# Patient Record
Sex: Female | Born: 1955 | Hispanic: No | Marital: Married | Smoking: Never smoker
Health system: Southern US, Community
[De-identification: ages and names within clinical notes are randomized; demographics above are authoritative.]

## PROBLEM LIST (undated history)

## (undated) DIAGNOSIS — E785 Hyperlipidemia, unspecified: Secondary | ICD-10-CM

## (undated) DIAGNOSIS — T7840XA Allergy, unspecified, initial encounter: Secondary | ICD-10-CM

## (undated) DIAGNOSIS — I82409 Acute embolism and thrombosis of unspecified deep veins of unspecified lower extremity: Secondary | ICD-10-CM

## (undated) DIAGNOSIS — IMO0002 Reserved for concepts with insufficient information to code with codable children: Secondary | ICD-10-CM

## (undated) DIAGNOSIS — Z86711 Personal history of pulmonary embolism: Secondary | ICD-10-CM

## (undated) HISTORY — PX: PROLAPSED UTERINE FIBROID LIGATION: SHX5400

## (undated) HISTORY — DX: Reserved for concepts with insufficient information to code with codable children: IMO0002

## (undated) HISTORY — DX: Hyperlipidemia, unspecified: E78.5

## (undated) HISTORY — PX: BREAST CYST EXCISION: SHX579

## (undated) HISTORY — PX: ABDOMINAL HYSTERECTOMY: SHX81

## (undated) HISTORY — DX: Allergy, unspecified, initial encounter: T78.40XA

---

## 1995-01-11 DIAGNOSIS — Z86711 Personal history of pulmonary embolism: Secondary | ICD-10-CM

## 1995-01-11 DIAGNOSIS — I82409 Acute embolism and thrombosis of unspecified deep veins of unspecified lower extremity: Secondary | ICD-10-CM

## 1995-01-11 HISTORY — DX: Acute embolism and thrombosis of unspecified deep veins of unspecified lower extremity: I82.409

## 1995-01-11 HISTORY — DX: Personal history of pulmonary embolism: Z86.711

## 2006-06-22 ENCOUNTER — Ambulatory Visit: Payer: Self-pay | Admitting: Family Medicine

## 2007-07-05 ENCOUNTER — Ambulatory Visit: Payer: Self-pay

## 2007-11-11 ENCOUNTER — Ambulatory Visit: Payer: Self-pay | Admitting: Internal Medicine

## 2007-11-21 ENCOUNTER — Ambulatory Visit: Payer: Self-pay | Admitting: Unknown Physician Specialty

## 2007-11-29 ENCOUNTER — Ambulatory Visit: Payer: Self-pay | Admitting: Unknown Physician Specialty

## 2007-12-11 ENCOUNTER — Ambulatory Visit: Payer: Self-pay | Admitting: Internal Medicine

## 2007-12-14 ENCOUNTER — Ambulatory Visit: Payer: Self-pay | Admitting: Internal Medicine

## 2007-12-19 ENCOUNTER — Emergency Department: Payer: Self-pay | Admitting: Unknown Physician Specialty

## 2008-01-11 ENCOUNTER — Ambulatory Visit: Payer: Self-pay | Admitting: Internal Medicine

## 2008-03-26 ENCOUNTER — Ambulatory Visit: Payer: Self-pay | Admitting: Internal Medicine

## 2008-04-10 ENCOUNTER — Ambulatory Visit: Payer: Self-pay | Admitting: Internal Medicine

## 2009-06-07 ENCOUNTER — Ambulatory Visit: Payer: Self-pay | Admitting: Internal Medicine

## 2009-06-25 ENCOUNTER — Ambulatory Visit: Payer: Self-pay | Admitting: Family Medicine

## 2010-07-29 ENCOUNTER — Ambulatory Visit: Payer: Self-pay | Admitting: Family Medicine

## 2011-08-03 ENCOUNTER — Ambulatory Visit: Payer: Self-pay | Admitting: Family Medicine

## 2013-11-27 ENCOUNTER — Ambulatory Visit: Payer: Self-pay | Admitting: Family Medicine

## 2015-11-19 ENCOUNTER — Other Ambulatory Visit: Payer: Self-pay | Admitting: Family Medicine

## 2015-11-19 DIAGNOSIS — Z1231 Encounter for screening mammogram for malignant neoplasm of breast: Secondary | ICD-10-CM

## 2015-11-23 ENCOUNTER — Ambulatory Visit
Admission: RE | Admit: 2015-11-23 | Discharge: 2015-11-23 | Disposition: A | Discharge status: Home / Self Care | Payer: BC Managed Care – PPO | Source: Ambulatory Visit | Attending: Family Medicine | Admitting: Family Medicine

## 2015-11-23 ENCOUNTER — Encounter (INDEPENDENT_AMBULATORY_CARE_PROVIDER_SITE_OTHER): Payer: Self-pay

## 2015-11-23 DIAGNOSIS — Z1231 Encounter for screening mammogram for malignant neoplasm of breast: Secondary | ICD-10-CM | POA: Insufficient documentation

## 2016-05-01 ENCOUNTER — Ambulatory Visit
Admission: EM | Admit: 2016-05-01 | Discharge: 2016-05-01 | Disposition: A | Discharge status: Home / Self Care | Payer: BC Managed Care – PPO | Attending: Family Medicine | Admitting: Family Medicine

## 2016-05-01 ENCOUNTER — Encounter: Payer: Self-pay | Admitting: Emergency Medicine

## 2016-05-01 DIAGNOSIS — L237 Allergic contact dermatitis due to plants, except food: Secondary | ICD-10-CM

## 2016-05-01 DIAGNOSIS — R21 Rash and other nonspecific skin eruption: Secondary | ICD-10-CM

## 2016-05-01 MED ORDER — PREDNISONE 20 MG PO TABS: ORAL_TABLET | ORAL | 0 refills | Status: DC

## 2016-05-01 NOTE — ED Triage Notes (Signed)
Patient c/o itchy rash on her arms, thighs, and abdomen that started on Friday.

## 2016-05-01 NOTE — ED Provider Notes (Signed)
[facility]    CSN: [id number] Arrival date & time: 05/01/16  1349     History   Chief Complaint Chief Complaint  Patient presents with  . Rash    HPI Elizabeth Frye is a 61 y.o. female.   61 yo female with a c/o itchy, blistery rash to legs, forearms and abdomen after exposure to poison oak/ivy last week. Has been putting calamine lotion and taking benadryl but not improved.   The history is provided by the patient.    History reviewed. No pertinent past medical history.  There are no active problems to display for this patient.   Past Surgical History:  Procedure Laterality Date  . ABDOMINAL HYSTERECTOMY    . BREAST CYST EXCISION Right    neg    OB History    No data available       Home Medications    Prior to Admission medications   Medication Sig Start Date End Date Taking? Authorizing Provider  simvastatin (ZOCOR) 40 MG tablet Take 40 mg by mouth daily.   Yes Historical Provider, MD  predniSONE (DELTASONE) 20 MG tablet 3 tabs po qd for 2 days, then 2 tabs po qd for 3 days, then 1 tab po qd for 4 days, then half a tab po qd for 3 days 05/01/16   Norval Gable, MD    Family History Family History  Problem Relation Age of Onset  . Breast cancer Neg Hx     Social History Social History  Substance Use Topics  . Smoking status: Never Smoker  . Smokeless tobacco: Never Used  . Alcohol use Yes     Allergies   Sulfa antibiotics   Review of Systems Review of Systems   Physical Exam Triage Vital Signs ED Triage Vitals  Enc Vitals Group     BP 05/01/16 1452 (!) 143/69     Pulse Rate 05/01/16 1452 94     Resp 05/01/16 1452 16     Temp 05/01/16 1452 98.4 F (36.9 C)     Temp Source 05/01/16 1452 Oral     SpO2 05/01/16 1452 99 %     Weight 05/01/16 1450 160 lb (72.6 kg)     Height 05/01/16 1450 5\' 1"  (1.549 m)     Head Circumference --      Peak Flow --      Pain Score 05/01/16 1450 2     Pain Loc --      Pain Edu? --        Excl. in [place]? --    No data found.   Updated Vital Signs BP (!) 143/69 (BP Location: Left Arm)   Pulse 94   Temp 98.4 F (36.9 C) (Oral)   Resp 16   Ht 5\' 1"  (1.549 m)   Wt 160 lb (72.6 kg)   SpO2 99%   BMI 30.23 kg/m   Visual Acuity Right Eye Distance:   Left Eye Distance:   Bilateral Distance:    Right Eye Near:   Left Eye Near:    Bilateral Near:     Physical Exam  Constitutional: She appears well-developed and well-nourished. No distress.  Skin: Rash noted. Rash is vesicular. She is not diaphoretic. There is erythema.     Nursing note and vitals reviewed.    UC Treatments / Results  Labs (all labs ordered are listed, but only abnormal results are displayed) Labs Reviewed - No data to display  EKG  EKG Interpretation None  Radiology No results found.  Procedures Procedures (including critical care time)  Medications Ordered in UC Medications - No data to display   Initial Impression / Assessment and Plan / UC Course  I have reviewed the triage vital signs and the nursing notes.  Pertinent labs & imaging results that were available during my care of the patient were reviewed by me and considered in my medical decision making (see chart for details).       Final Clinical Impressions(s) / UC Diagnoses   Final diagnoses:  Contact dermatitis due to poison oak    New Prescriptions Discharge Medication List as of 05/01/2016  2:59 PM    START taking these medications   Details  predniSONE (DELTASONE) 20 MG tablet 3 tabs po qd for 2 days, then 2 tabs po qd for 3 days, then 1 tab po qd for 4 days, then half a tab po qd for 3 days, Normal       1. diagnosis reviewed with patient 2. rx as per orders above; reviewed possible side effects, interactions, risks and benefits  3. Recommend supportive treatment with otc antihistamines prn 4. Follow-up prn if symptoms worsen or don't improve   Norval Gable, MD 05/01/16 1505

## 2016-10-31 LAB — HIV ANTIBODY (ROUTINE TESTING W REFLEX): HIV 1&2 Ab, 4th Generation: NEGATIVE

## 2017-11-08 LAB — FECAL OCCULT BLOOD, GUAIAC: Fecal Occult Blood: POSITIVE

## 2017-12-22 ENCOUNTER — Other Ambulatory Visit: Payer: Self-pay

## 2017-12-22 ENCOUNTER — Other Ambulatory Visit: Payer: Self-pay | Admitting: Family Medicine

## 2017-12-22 DIAGNOSIS — Z1231 Encounter for screening mammogram for malignant neoplasm of breast: Secondary | ICD-10-CM

## 2017-12-22 DIAGNOSIS — Z1211 Encounter for screening for malignant neoplasm of colon: Secondary | ICD-10-CM

## 2017-12-22 MED ORDER — NA SULFATE-K SULFATE-MG SULF 17.5-3.13-1.6 GM/177ML PO SOLN: 1.0000 | ORAL | 0 refills | Status: DC

## 2018-01-04 NOTE — Discharge Instructions (Signed)
General Anesthesia, Adult, Care After  This sheet gives you information about how to care for yourself after your procedure. Your health care provider may also give you more specific instructions. If you have problems or questions, contact your health care provider.  What can I expect after the procedure?  After the procedure, the following side effects are common:  Pain or discomfort at the IV site.  Nausea.  Vomiting.  Sore throat.  Trouble concentrating.  Feeling cold or chills.  Weak or tired.  Sleepiness and fatigue.  Soreness and body aches. These side effects can affect parts of the body that were not involved in surgery.  Follow these instructions at home:    For at least 24 hours after the procedure:  Have a responsible adult stay with you. It is important to have someone help care for you until you are awake and alert.  Rest as needed.  Do not:  Participate in activities in which you could fall or become injured.  Drive.  Use heavy machinery.  Drink alcohol.  Take sleeping pills or medicines that cause drowsiness.  Make important decisions or sign legal documents.  Take care of children on your own.  Eating and drinking  Follow any instructions from your health care provider about eating or drinking restrictions.  When you feel hungry, start by eating small amounts of foods that are soft and easy to digest (bland), such as toast. Gradually return to your regular diet.  Drink enough fluid to keep your urine pale yellow.  If you vomit, rehydrate by drinking water, juice, or clear broth.  General instructions  If you have sleep apnea, surgery and certain medicines can increase your risk for breathing problems. Follow instructions from your health care provider about wearing your sleep device:  Anytime you are sleeping, including during daytime naps.  While taking prescription pain medicines, sleeping medicines, or medicines that make you drowsy.  Return to your normal activities as told by your health care  provider. Ask your health care provider what activities are safe for you.  Take over-the-counter and prescription medicines only as told by your health care provider.  If you smoke, do not smoke without supervision.  Keep all follow-up visits as told by your health care provider. This is important.  Contact a health care provider if:  You have nausea or vomiting that does not get better with medicine.  You cannot eat or drink without vomiting.  You have pain that does not get better with medicine.  You are unable to pass urine.  You develop a skin rash.  You have a fever.  You have redness around your IV site that gets worse.  Get help right away if:  You have difficulty breathing.  You have chest pain.  You have blood in your urine or stool, or you vomit blood.  Summary  After the procedure, it is common to have a sore throat or nausea. It is also common to feel tired.  Have a responsible adult stay with you for the first 24 hours after general anesthesia. It is important to have someone help care for you until you are awake and alert.  When you feel hungry, start by eating small amounts of foods that are soft and easy to digest (bland), such as toast. Gradually return to your regular diet.  Drink enough fluid to keep your urine pale yellow.  Return to your normal activities as told by your health care provider. Ask your health care   provider what activities are safe for you.  This information is not intended to replace advice given to you by your health care provider. Make sure you discuss any questions you have with your health care provider.  Document Released: 04/04/2000 Document Revised: 08/12/2016 Document Reviewed: 08/12/2016  Elsevier Interactive Patient Education  2019 [employer]vier Inc[employer]

## 2018-01-05 ENCOUNTER — Ambulatory Visit: Payer: BC Managed Care – PPO | Admitting: Anesthesiology

## 2018-01-05 ENCOUNTER — Ambulatory Visit
Admission: RE | Admit: 2018-01-05 | Discharge: 2018-01-05 | Disposition: A | Discharge status: Home / Self Care | Payer: BC Managed Care – PPO | Source: Ambulatory Visit | Attending: Gastroenterology | Admitting: Gastroenterology

## 2018-01-05 ENCOUNTER — Encounter
Admission: RE | Disposition: A | Discharge status: Home / Self Care | Payer: Self-pay | Source: Ambulatory Visit | Attending: Gastroenterology

## 2018-01-05 DIAGNOSIS — Z86711 Personal history of pulmonary embolism: Secondary | ICD-10-CM | POA: Diagnosis not present

## 2018-01-05 DIAGNOSIS — D120 Benign neoplasm of cecum: Secondary | ICD-10-CM

## 2018-01-05 DIAGNOSIS — Z1211 Encounter for screening for malignant neoplasm of colon: Secondary | ICD-10-CM | POA: Insufficient documentation

## 2018-01-05 DIAGNOSIS — Z86718 Personal history of other venous thrombosis and embolism: Secondary | ICD-10-CM | POA: Insufficient documentation

## 2018-01-05 DIAGNOSIS — K5730 Diverticulosis of large intestine without perforation or abscess without bleeding: Secondary | ICD-10-CM | POA: Insufficient documentation

## 2018-01-05 DIAGNOSIS — K635 Polyp of colon: Secondary | ICD-10-CM | POA: Diagnosis not present

## 2018-01-05 DIAGNOSIS — K641 Second degree hemorrhoids: Secondary | ICD-10-CM | POA: Insufficient documentation

## 2018-01-05 DIAGNOSIS — D123 Benign neoplasm of transverse colon: Secondary | ICD-10-CM

## 2018-01-05 DIAGNOSIS — Z79899 Other long term (current) drug therapy: Secondary | ICD-10-CM | POA: Diagnosis not present

## 2018-01-05 DIAGNOSIS — Z860100 Personal history of colon polyps, unspecified: Secondary | ICD-10-CM

## 2018-01-05 HISTORY — DX: Personal history of pulmonary embolism: Z86.711

## 2018-01-05 HISTORY — PX: POLYPECTOMY: SHX149

## 2018-01-05 HISTORY — PX: COLONOSCOPY WITH PROPOFOL: SHX5780

## 2018-01-05 HISTORY — DX: Acute embolism and thrombosis of unspecified deep veins of unspecified lower extremity: I82.409

## 2018-01-05 SURGERY — COLONOSCOPY WITH PROPOFOL
Anesthesia: General

## 2018-01-05 MED ORDER — SODIUM CHLORIDE 0.9 % IV SOLN: INTRAVENOUS | Status: DC

## 2018-01-05 MED ORDER — LIDOCAINE HCL (CARDIAC) PF 100 MG/5ML IV SOSY
PREFILLED_SYRINGE | INTRAVENOUS | Status: DC | PRN
  Administered 2018-01-05: 30 mg via INTRAVENOUS

## 2018-01-05 MED ORDER — LACTATED RINGERS IV SOLN
INTRAVENOUS | Status: DC
  Administered 2018-01-05: 08:00:00 via INTRAVENOUS

## 2018-01-05 MED ORDER — ACETAMINOPHEN 160 MG/5ML PO SOLN: 325.0000 mg | Freq: Once | ORAL | Status: DC

## 2018-01-05 MED ORDER — PROPOFOL 10 MG/ML IV BOLUS
INTRAVENOUS | Status: DC | PRN
  Administered 2018-01-05: 30 mg via INTRAVENOUS
  Administered 2018-01-05: 20 mg via INTRAVENOUS
  Administered 2018-01-05 (×8): 50 mg via INTRAVENOUS

## 2018-01-05 MED ORDER — ACETAMINOPHEN 325 MG PO TABS: 325.0000 mg | ORAL_TABLET | Freq: Once | ORAL | Status: DC

## 2018-01-05 SURGICAL SUPPLY — 21 items
CANISTER SUCT 1200ML W/VALVE (MISCELLANEOUS) ×3 IMPLANT
CLIP HMST 235XBRD CATH ROT (MISCELLANEOUS) IMPLANT
CLIP RESOLUTION 360 11X235 (MISCELLANEOUS)
ELECT REM PT RETURN 9FT ADLT (ELECTROSURGICAL)
ELECTRODE REM PT RTRN 9FT ADLT (ELECTROSURGICAL) IMPLANT
FCP ESCP3.2XJMB 240X2.8X (MISCELLANEOUS)
FORCEPS BIOP RAD 4 LRG CAP 4 (CUTTING FORCEPS) IMPLANT
FORCEPS BIOP RJ4 240 W/NDL (MISCELLANEOUS)
FORCEPS ESCP3.2XJMB 240X2.8X (MISCELLANEOUS) IMPLANT
GOWN CVR UNV OPN BCK APRN NK (MISCELLANEOUS) ×4 IMPLANT
GOWN ISOL THUMB LOOP REG UNIV (MISCELLANEOUS) ×2
INJECTOR VARIJECT VIN23 (MISCELLANEOUS) ×2 IMPLANT
KIT ENDO PROCEDURE OLY (KITS) ×3 IMPLANT
PROBE APC STR FIRE (PROBE) IMPLANT
SNARE SHORT THROW 13M SML OVAL (MISCELLANEOUS) ×3 IMPLANT
SNARE SHORT THROW 30M LRG OVAL (MISCELLANEOUS) IMPLANT
SNARE SNG USE RND 15MM (INSTRUMENTS) IMPLANT
SNARE SPIRAL (MISCELLANEOUS) ×3 IMPLANT
TRAP ETRAP POLY (MISCELLANEOUS) ×3 IMPLANT
VARIJECT INJECTOR VIN23 (MISCELLANEOUS) ×3
WATER STERILE IRR 250ML POUR (IV SOLUTION) ×3 IMPLANT

## 2018-01-05 NOTE — Anesthesia Postprocedure Evaluation (Signed)
Anesthesia Post Note  Patient: Elizabeth Frye  Procedure(s) Performed: COLONOSCOPY WITH PROPOFOL (N/A ) POLYPECTOMY INTESTINAL  Patient location during evaluation: PACU Anesthesia Type: General Level of consciousness: awake and alert and oriented Pain management: satisfactory to patient Vital Signs Assessment: post-procedure vital signs reviewed and stable Respiratory status: spontaneous breathing, nonlabored ventilation and respiratory function stable Cardiovascular status: blood pressure returned to baseline and stable Postop Assessment: Adequate PO intake and No signs of nausea or vomiting Anesthetic complications: no    Raliegh Ip

## 2018-01-05 NOTE — H&P (Signed)
Lucilla Lame, MD [address] [place], [place] [address] Phone: [phone] Fax : [phone]  Primary Care Physician:  Petra Kuba, MD Primary Gastroenterologist:  Dr. Allen Norris  Pre-Procedure History & Physical: HPI:  Elizabeth Frye is a 62 y.o. female is here for a screening colonoscopy.   Past Medical History:  Diagnosis Date  . DVT, lower extremity ([place]) 1997   after injury to leg  . History of pulmonary embolus ([place]) 1997    Past Surgical History:  Procedure Laterality Date  . ABDOMINAL HYSTERECTOMY    . BREAST CYST EXCISION Right    neg    Prior to Admission medications   Medication Sig Start Date End Date Taking? Authorizing Provider  Multiple Vitamins-Minerals (WOMENS MULTIVITAMIN PO) Take by mouth.   Yes [provider]  Na Sulfate-K Sulfate-Mg Sulf (SUPREP BOWEL PREP KIT) 17.5-3.13-1.6 GM/177ML SOLN Take 1 kit by mouth as directed. 12/22/17  Yes Lucilla Lame, MD  Potassium 75 MG TABS Take by mouth.   Yes [provider]  simvastatin (ZOCOR) 40 MG tablet Take 40 mg by mouth daily.   Yes [provider]  predniSONE (DELTASONE) 20 MG tablet 3 tabs po qd for 2 days, then 2 tabs po qd for 3 days, then 1 tab po qd for 4 days, then half a tab po qd for 3 days Patient not taking: Reported on 01/01/2018 05/01/16   Norval Gable, MD    Allergies as of 12/22/2017 - Review Complete 05/01/2016  Allergen Reaction Noted  . Sulfa antibiotics Rash 05/01/2016    Family History  Problem Relation Age of Onset  . Breast cancer Neg Hx     Social History   Socioeconomic History  . Marital status: Married    Spouse name: Not on file  . Number of children: Not on file  . Years of education: Not on file  . Highest education level: Not on file  Occupational History  . Not on file  Social Needs  . Financial resource strain: Not on file  . Food insecurity:    Worry: Not on file    Inability: Not on file  . Transportation  needs:    Medical: Not on file    Non-medical: Not on file  Tobacco Use  . Smoking status: Never Smoker  . Smokeless tobacco: Never Used  Substance and Sexual Activity  . Alcohol use: Yes    Alcohol/week: 3.0 - 4.0 standard drinks    Types: 3 - 4 Glasses of wine per week  . Drug use: Not on file  . Sexual activity: Not on file  Lifestyle  . Physical activity:    Days per week: Not on file    Minutes per session: Not on file  . Stress: Not on file  Relationships  . Social connections:    Talks on phone: Not on file    Gets together: Not on file    Attends religious service: Not on file    Active member of club or organization: Not on file    Attends meetings of clubs or organizations: Not on file    Relationship status: Not on file  . Intimate partner violence:    Fear of current or ex partner: Not on file    Emotionally abused: Not on file    Physically abused: Not on file    Forced sexual activity: Not on file  Other Topics Concern  . Not on file  Social History Narrative  .  Not on file    Review of Systems: See HPI, otherwise negative ROS  Physical Exam: BP (!) 150/76   Pulse 95   Temp 98.1 F (36.7 C) (Temporal)   Ht 5' 1"  (1.549 m)   Wt 78.5 kg   SpO2 100%   BMI 32.69 kg/m  General:   Alert,  pleasant and cooperative in NAD Head:  Normocephalic and atraumatic. Neck:  Supple; no masses or thyromegaly. Lungs:  Clear throughout to auscultation.    Heart:  Regular rate and rhythm. Abdomen:  Soft, nontender and nondistended. Normal bowel sounds, without guarding, and without rebound.   Neurologic:  Alert and  oriented x4;  grossly normal neurologically.  Impression/Plan: Shivani Barrantes is now here to undergo a screening colonoscopy.  Risks, benefits, and alternatives regarding colonoscopy have been reviewed with the patient.  Questions have been answered.  All parties agreeable.

## 2018-01-05 NOTE — Transfer of Care (Signed)
Immediate Anesthesia Transfer of Care Note  Patient: Elizabeth Frye  Procedure(s) Performed: COLONOSCOPY WITH PROPOFOL (N/A ) POLYPECTOMY INTESTINAL  Patient Location: PACU  Anesthesia Type: General  Level of Consciousness: awake, alert  and patient cooperative  Airway and Oxygen Therapy: Patient Spontanous Breathing and Patient connected to supplemental oxygen  Post-op Assessment: Post-op Vital signs reviewed, Patient's Cardiovascular Status Stable, Respiratory Function Stable, Patent Airway and No signs of Nausea or vomiting  Post-op Vital Signs: Reviewed and stable  Complications: No apparent anesthesia complications

## 2018-01-05 NOTE — Op Note (Signed)
[facility]astroenterology Patient Name: Elizabeth Frye Procedure Date: 01/05/2018 8:46 AM MRN: [id number] Account #: [id number] Date of Birth: Dec 17, 1955 Admit Type: Outpatient Age: 62 Room: [facility] OR ROOM 01 Gender: Female Note Status: Finalized Procedure:            Colonoscopy Indications:          Screening for colorectal malignant neoplasm Providers:            Lucilla Lame MD, MD Referring MD:         Sena Hitch, MD (Referring MD) Medicines:            Propofol per Anesthesia Complications:        No immediate complications. Procedure:            Pre-Anesthesia Assessment:                       - Prior to the procedure, a History and Physical was                        performed, and patient medications and allergies were                        reviewed. The patient's tolerance of previous                        anesthesia was also reviewed. The risks and benefits of                        the procedure and the sedation options and risks were                        discussed with the patient. All questions were                        answered, and informed consent was obtained. Prior                        Anticoagulants: The patient has taken no previous                        anticoagulant or antiplatelet agents. ASA Grade                        Assessment: II - A patient with mild systemic disease.                        After reviewing the risks and benefits, the patient was                        deemed in satisfactory condition to undergo the                        procedure.                       After obtaining informed consent, the colonoscope was                        passed under direct vision. Throughout the procedure,  the patient's blood pressure, pulse, and oxygen                        saturations were monitored continuously. The                        Colonoscope was introduced through the anus and           advanced to the the terminal ileum. The colonoscopy was                        performed without difficulty. The patient tolerated the                        procedure well. The quality of the bowel preparation                        was excellent. Findings:      The perianal and digital rectal examinations were normal.      A 9 mm polyp was found in the ileocecal valve. The polyp was sessile.       Area was successfully injected with saline with indigo carmine for a       lift polypectomy. The polyp was removed with a hot snare. Resection and       retrieval were complete.      A 5 mm polyp was found in the hepatic flexure. The polyp was sessile.       The polyp was removed with a cold snare. Resection and retrieval were       complete.      Multiple small-mouthed diverticula were found in the sigmoid colon.      Non-bleeding internal hemorrhoids were found during retroflexion. The       hemorrhoids were Grade II (internal hemorrhoids that prolapse but reduce       spontaneously). Impression:           - One 9 mm polyp at the ileocecal valve, removed with a                        hot snare. Resected and retrieved. Injected.                       - One 5 mm polyp at the hepatic flexure, removed with a                        cold snare. Resected and retrieved.                       - Diverticulosis in the sigmoid colon.                       - Non-bleeding internal hemorrhoids. Recommendation:       - Discharge patient to home.                       - Resume previous diet.                       - Continue present medications.                       - Await pathology results.                       -  Repeat colonoscopy in 1 years if polyp adenoma and 10                        years if hyperplastic Procedure Code(s):    --- Professional ---                       [phone], Colonoscopy, flexible; with removal of tumor(s),                        polyp(s), or other lesion(s) by snare  technique                       45381, Colonoscopy, flexible; with directed submucosal                        injection(s), any substance Diagnosis Code(s):    --- Professional ---                       Z12.11, Encounter for screening for malignant neoplasm                        of colon                       D12.0, Benign neoplasm of cecum                       D12.3, Benign neoplasm of transverse colon (hepatic                        flexure or splenic flexure) CPT copyright 2018 American Medical Association. All rights reserved. The codes documented in this report are preliminary and upon coder review may  be revised to meet current compliance requirements. Lucilla Lame MD, MD 01/05/2018 9:25:17 AM This report has been signed electronically. Number of Addenda: 0 Note Initiated On: 01/05/2018 8:46 AM Scope Withdrawal Time: 0 hours 22 minutes 5 seconds  Total Procedure Duration: 0 hours 24 minutes 27 seconds       [facility]

## 2018-01-05 NOTE — Anesthesia Preprocedure Evaluation (Signed)
Anesthesia Evaluation  Patient identified by MRN, date of birth, ID band Patient awake    Reviewed: Allergy & Precautions, H&P , NPO status , Patient's Chart, lab work & pertinent test results  Airway Mallampati: II  TM Distance: >3 FB Neck ROM: full    Dental no notable dental hx.    Pulmonary    Pulmonary exam normal breath sounds clear to auscultation       Cardiovascular Normal cardiovascular exam Rhythm:regular Rate:Normal     Neuro/Psych    GI/Hepatic   Endo/Other    Renal/GU      Musculoskeletal   Abdominal   Peds  Hematology   Anesthesia Other Findings   Reproductive/Obstetrics                             Anesthesia Physical Anesthesia Plan  ASA: II  Anesthesia Plan: General   Post-op Pain Management:    Induction: Intravenous  PONV Risk Score and Plan: 3 and Propofol infusion and Treatment may vary due to age or medical condition  Airway Management Planned: Natural Airway  Additional Equipment:   Intra-op Plan:   Post-operative Plan:   Informed Consent: I have reviewed the patients History and Physical, chart, labs and discussed the procedure including the risks, benefits and alternatives for the proposed anesthesia with the patient or authorized representative who has indicated his/her understanding and acceptance.     Plan Discussed with: CRNA  Anesthesia Plan Comments:         Anesthesia Quick Evaluation

## 2018-01-08 ENCOUNTER — Encounter: Payer: Self-pay | Admitting: Gastroenterology

## 2018-01-09 ENCOUNTER — Ambulatory Visit
Admission: RE | Admit: 2018-01-09 | Discharge: 2018-01-09 | Disposition: A | Discharge status: Home / Self Care | Payer: BC Managed Care – PPO | Source: Ambulatory Visit | Attending: Family Medicine | Admitting: Family Medicine

## 2018-01-09 ENCOUNTER — Encounter: Payer: Self-pay | Admitting: Gastroenterology

## 2018-01-09 DIAGNOSIS — Z1231 Encounter for screening mammogram for malignant neoplasm of breast: Secondary | ICD-10-CM | POA: Diagnosis present

## 2018-12-14 LAB — HM PAP SMEAR: HM Pap smear: NEGATIVE

## 2018-12-14 LAB — LIPID PANEL: Cholesterol: 214 — AB (ref 0–200)

## 2018-12-14 LAB — HEMOGLOBIN A1C: Hemoglobin A1C: 5.6

## 2018-12-15 LAB — LIPID PANEL
Cholesterol: 214 — AB (ref 0–200)
HDL: 53 (ref 35–70)
LDL Cholesterol: 113
Triglycerides: 277 — AB (ref 40–160)

## 2018-12-15 LAB — TSH: TSH: 1.4 (ref 0.41–5.90)

## 2018-12-15 LAB — HEMOGLOBIN A1C: Hemoglobin A1C: 5.6

## 2018-12-26 ENCOUNTER — Other Ambulatory Visit: Payer: Self-pay | Admitting: Internal Medicine

## 2018-12-26 ENCOUNTER — Encounter: Payer: Self-pay | Admitting: Internal Medicine

## 2018-12-26 ENCOUNTER — Ambulatory Visit: Payer: BC Managed Care – PPO | Admitting: Internal Medicine

## 2018-12-26 ENCOUNTER — Other Ambulatory Visit: Payer: Self-pay

## 2018-12-26 VITALS — BP 132/90 | HR 100 | Ht 61.0 in | Wt 176.0 lb

## 2018-12-26 DIAGNOSIS — Z6833 Body mass index (BMI) 33.0-33.9, adult: Secondary | ICD-10-CM | POA: Diagnosis not present

## 2018-12-26 DIAGNOSIS — E785 Hyperlipidemia, unspecified: Secondary | ICD-10-CM | POA: Insufficient documentation

## 2018-12-26 NOTE — Progress Notes (Signed)
Date:  12/26/2018   Name:  Elizabeth Frye   DOB:  August 07, 1955   MRN:  [id number]   Chief Complaint: [place] patient today.  She recently had CPX with her other provider in November.  She has had mammogram and recently colonoscopy.  She is currently doing well with no complaints. Hyperlipidemia This is a chronic problem. The problem is controlled. Pertinent negatives include no chest pain or shortness of breath. Current antihyperlipidemic treatment includes statins. The current treatment provides significant improvement of lipids.   Lab Results  Component Value Date   CHOL 214 (A) 12/14/2018   Lab Results  Component Value Date   HGBA1C 5.6 12/14/2018     Review of Systems  Constitutional: Negative for chills, fatigue and unexpected weight change.  Respiratory: Negative for chest tightness and shortness of breath.   Cardiovascular: Negative for chest pain and leg swelling.  Neurological: Negative for dizziness, light-headedness and headaches.    Patient Active Problem List   Diagnosis Date Noted  . Colon cancer screening   . Benign neoplasm of cecum   . Benign neoplasm of transverse colon     Allergies  Allergen Reactions  . Sulfa Antibiotics Rash    Past Surgical History:  Procedure Laterality Date  . ABDOMINAL HYSTERECTOMY     still have cervix and one ovary  . BREAST CYST EXCISION Right    neg  . COLONOSCOPY WITH PROPOFOL N/A 01/05/2018   Procedure: COLONOSCOPY WITH PROPOFOL;  Surgeon: Lucilla Lame, MD;  Location: [facility];  Service: Endoscopy;  Laterality: N/A;  . POLYPECTOMY  01/05/2018   Procedure: POLYPECTOMY INTESTINAL;  Surgeon: Lucilla Lame, MD;  Location: [facility];  Service: Endoscopy;;  . PROLAPSED UTERINE FIBROID LIGATION      Social History   Tobacco Use  . Smoking status: Never Smoker  . Smokeless tobacco: Never Used  Substance Use Topics  . Alcohol use: Yes    Alcohol/week: 3.0 standard drinks    Types:  3 Glasses of wine per week  . Drug use: Never     Medication list has been reviewed and updated.  Current Meds  Medication Sig  . Multiple Vitamins-Minerals (WOMENS MULTIVITAMIN PO) Take by mouth.  . Potassium 75 MG TABS Take by mouth.  . simvastatin (ZOCOR) 40 MG tablet Take 40 mg by mouth daily.    PHQ 2/9 Scores 12/26/2018  PHQ - 2 Score 0    BP Readings from Last 3 Encounters:  12/26/18 132/90  01/05/18 113/84  05/01/16 (!) 143/69    Physical Exam Vitals and nursing note reviewed.  Constitutional:      General: She is not in acute distress.    Appearance: Normal appearance. She is well-developed.  HENT:     Head: Normocephalic and atraumatic.  Neck:     Vascular: No carotid bruit.  Cardiovascular:     Rate and Rhythm: Normal rate and regular rhythm.     Pulses: Normal pulses.     Heart sounds: No murmur.  Pulmonary:     Effort: Pulmonary effort is normal. No respiratory distress.     Breath sounds: No wheezing or rhonchi.  Musculoskeletal:     Right lower leg: No edema.     Left lower leg: No edema.  Lymphadenopathy:     Cervical: No cervical adenopathy.  Skin:    General: Skin is warm and dry.     Capillary Refill: Capillary refill takes less than 2 seconds.  Findings: No rash.  Neurological:     General: No focal deficit present.     Mental Status: She is alert and oriented to person, place, and time.     Gait: Gait normal.  Psychiatric:        Behavior: Behavior normal.        Thought Content: Thought content normal.     Wt Readings from Last 3 Encounters:  12/26/18 176 lb (79.8 kg)  01/05/18 173 lb (78.5 kg)  05/01/16 160 lb (72.6 kg)    BP 132/90   Pulse 100   Ht 5\' 1"  (1.549 m)   Wt 176 lb (79.8 kg)   SpO2 95%   BMI 33.25 kg/m   Assessment and Plan: 1. Hyperlipidemia, mild Controlled on statin therapy - recent labs will be requested Continue same dose and call for refill when needed  2. BMI 33.0-33.9,adult Continue to work on  healthy diet and regular exercise Recent A1C was normal.  Records for immunizations and screening labs will be requested.  Partially dictated using Editor, commissioning. Any errors are unintentional.  Halina Maidens, MD [facility]  12/26/2018

## 2019-01-24 LAB — HM MAMMOGRAPHY: HM Mammogram: NORMAL (ref 0–4)

## 2019-04-04 ENCOUNTER — Ambulatory Visit: Payer: BC Managed Care – PPO | Attending: Internal Medicine

## 2019-04-04 DIAGNOSIS — Z23 Encounter for immunization: Secondary | ICD-10-CM

## 2019-04-04 NOTE — Progress Notes (Signed)
   Covid-19 Vaccination Clinic  Name:  Elizabeth Frye    MRN: [id number] DOB: [birth year]  04/04/2019  Elizabeth Frye was observed post Covid-19 immunization for 15 minutes without incident. She was provided with Vaccine Information Sheet and instruction to access the V-Safe system.   Elizabeth Frye was instructed to call 911 with any severe reactions post vaccine: Marland Kitchen Difficulty breathing  . Swelling of face and throat  . A fast heartbeat  . A bad rash all over body  . Dizziness and weakness   Immunizations Administered    Name Date Dose VIS Date Route   Pfizer COVID-19 Vaccine 04/04/2019 11:16 AM 0.3 mL 12/21/2018 Intramuscular   Manufacturer: [place]   Lot: [id number]   [place]: [id number]

## 2019-04-29 ENCOUNTER — Ambulatory Visit: Payer: BC Managed Care – PPO | Attending: Internal Medicine

## 2019-04-29 DIAGNOSIS — Z23 Encounter for immunization: Secondary | ICD-10-CM

## 2019-04-29 NOTE — Progress Notes (Signed)
   Covid-19 Vaccination Clinic  Name:  Elizabeth Frye    MRN: [id number] DOB: 04-05-1955  04/29/2019  [name] was observed post Covid-19 immunization for 15 minutes without incident. She was provided with Vaccine Information Sheet and instruction to access the V-Safe system.   [name] was instructed to call 911 with any severe reactions post vaccine: Marland Kitchen Difficulty breathing  . Swelling of face and throat  . A fast heartbeat  . A bad rash all over body  . Dizziness and weakness   Immunizations Administered    Name Date Dose VIS Date Route   Pfizer COVID-19 Vaccine 04/29/2019 11:27 AM 0.3 mL 03/06/2018 Intramuscular   Manufacturer: [place]   Lot: [id number]   [place]: [id number]

## 2019-12-04 ENCOUNTER — Encounter: Payer: Self-pay | Admitting: Internal Medicine

## 2019-12-04 ENCOUNTER — Other Ambulatory Visit: Payer: Self-pay

## 2019-12-04 ENCOUNTER — Ambulatory Visit (INDEPENDENT_AMBULATORY_CARE_PROVIDER_SITE_OTHER): Payer: BC Managed Care – PPO | Admitting: Internal Medicine

## 2019-12-04 VITALS — BP 124/82 | HR 86 | Temp 98.1°F | Ht 61.0 in | Wt 180.0 lb

## 2019-12-04 DIAGNOSIS — E785 Hyperlipidemia, unspecified: Secondary | ICD-10-CM

## 2019-12-04 DIAGNOSIS — Z1159 Encounter for screening for other viral diseases: Secondary | ICD-10-CM | POA: Diagnosis not present

## 2019-12-04 DIAGNOSIS — Z0000 Encounter for general adult medical examination without abnormal findings: Secondary | ICD-10-CM | POA: Diagnosis not present

## 2019-12-04 DIAGNOSIS — Z1231 Encounter for screening mammogram for malignant neoplasm of breast: Secondary | ICD-10-CM

## 2019-12-04 MED ORDER — SIMVASTATIN 40 MG PO TABS: 40.0000 mg | ORAL_TABLET | Freq: Every day | ORAL | 3 refills | Status: DC

## 2019-12-04 NOTE — Patient Instructions (Signed)
Consider Shingrix vaccine for shingles.  Can schedule appt with the Nurse for the first injection if desired.

## 2019-12-04 NOTE — Progress Notes (Signed)
Date:  12/04/2019   Name:  Elizabeth Frye   DOB:  05/30/1955   MRN:  [id number]   Chief Complaint: Annual Exam (breast exam no pap)  Elizabeth Frye is a 64 y.o. female who presents today for her Complete Annual Exam. She feels well. She reports exercising walking up and down the stairs and yard work. She reports she is sleeping well. Breast complaints none.  Mammogram: 01/2017 [facility] DEXA: none Pap smear: 12/2018 neg with HPV Colonoscopy: 12/2017 repeat 2024  Immunization History  Administered Date(s) Administered  . Influenza Inj Mdck Quad Pf 11/02/2018  . Influenza-Unspecified 11/01/2019  . PFIZER SARS-COV-2 Vaccination 04/04/2019, 04/29/2019, 11/01/2019  . Tdap 11/18/2015  . Zoster 11/20/2014  Shingrix discussed.  Hyperlipidemia This is a chronic problem. The problem is controlled. Pertinent negatives include no chest pain or shortness of breath. Current antihyperlipidemic treatment includes statins. The current treatment provides significant improvement of lipids.    No results found for: CREATININE, BUN, NA, K, CL, CO2 Lab Results  Component Value Date   CHOL 214 (A) 12/15/2018   HDL 53 12/15/2018   LDLCALC 113 12/15/2018   TRIG 277 (A) 12/15/2018   Lab Results  Component Value Date   TSH 1.40 12/15/2018   Lab Results  Component Value Date   HGBA1C 5.6 12/15/2018   No results found for: WBC, HGB, HCT, MCV, PLT No results found for: ALT, AST, GGT, ALKPHOS, BILITOT   Review of Systems  Constitutional: Negative for chills, fatigue and fever.  HENT: Negative for congestion, hearing loss, tinnitus, trouble swallowing and voice change.   Eyes: Negative for visual disturbance.  Respiratory: Negative for cough, chest tightness, shortness of breath and wheezing.   Cardiovascular: Negative for chest pain, palpitations and leg swelling.  Gastrointestinal: Negative for abdominal pain, constipation, diarrhea and vomiting.  Endocrine: Negative for polydipsia and  polyuria.  Genitourinary: Negative for dysuria, frequency, genital sores, vaginal bleeding and vaginal discharge.  Musculoskeletal: Positive for arthralgias (pain on left foot). Negative for gait problem and joint swelling.  Skin: Negative for color change and rash.  Neurological: Negative for dizziness, tremors, light-headedness and headaches.  Hematological: Negative for adenopathy. Does not bruise/bleed easily.  Psychiatric/Behavioral: Negative for dysphoric mood and sleep disturbance. The patient is not nervous/anxious.     Patient Active Problem List   Diagnosis Date Noted  . Hyperlipidemia, mild 12/26/2018  . BMI 33.0-33.9,adult 12/26/2018  . Benign neoplasm of cecum   . Benign neoplasm of transverse colon     Allergies  Allergen Reactions  . Sulfa Antibiotics Rash    Past Surgical History:  Procedure Laterality Date  . ABDOMINAL HYSTERECTOMY     still have cervix and one ovary  . BREAST CYST EXCISION Right    neg  . COLONOSCOPY WITH PROPOFOL N/A 01/05/2018   Procedure: COLONOSCOPY WITH PROPOFOL;  Surgeon: Lucilla Lame, MD;  Location: [place];  Service: Endoscopy;  Laterality: N/A;  . POLYPECTOMY  01/05/2018   Procedure: POLYPECTOMY INTESTINAL;  Surgeon: Lucilla Lame, MD;  Location: [place];  Service: Endoscopy;;  . PROLAPSED UTERINE FIBROID LIGATION      Social History   Tobacco Use  . Smoking status: Never Smoker  . Smokeless tobacco: Never Used  Vaping Use  . Vaping Use: Never used  Substance Use Topics  . Alcohol use: Yes    Alcohol/week: 3.0 standard drinks    Types: 3 Glasses of wine per week  . Drug use: Never  Medication list has been reviewed and updated.  Current Meds  Medication Sig  . Multiple Vitamins-Minerals (WOMENS MULTIVITAMIN PO) Take by mouth.  . Potassium 75 MG TABS Take by mouth.  . simvastatin (ZOCOR) 40 MG tablet Take 40 mg by mouth daily.    PHQ 2/9 Scores 12/04/2019 12/26/2018  PHQ - 2 Score 0 0    PHQ- 9 Score 0 -    GAD 7 : Generalized Anxiety Score 12/04/2019  Nervous, Anxious, on Edge 0  Control/stop worrying 0  Worry too much - different things 0  Trouble relaxing 0  Restless 0  Easily annoyed or irritable 0  Afraid - awful might happen 0  Total GAD 7 Score 0    BP Readings from Last 3 Encounters:  12/04/19 140/74  12/26/18 132/90  01/05/18 113/84    Physical Exam Vitals and nursing note reviewed.  Constitutional:      General: She is not in acute distress.    Appearance: She is well-developed.  HENT:     Head: Normocephalic and atraumatic.     Right Ear: Tympanic membrane and ear canal normal.     Left Ear: Tympanic membrane and ear canal normal.     Nose:     Right Sinus: No maxillary sinus tenderness.     Left Sinus: No maxillary sinus tenderness.  Eyes:     General: No scleral icterus.       Right eye: No discharge.        Left eye: No discharge.     Conjunctiva/sclera: Conjunctivae normal.  Neck:     Thyroid: No thyromegaly.     Vascular: No carotid bruit.  Cardiovascular:     Rate and Rhythm: Normal rate and regular rhythm.     Pulses: Normal pulses.     Heart sounds: Normal heart sounds.  Pulmonary:     Effort: Pulmonary effort is normal. No respiratory distress.     Breath sounds: No wheezing.  Chest:     Breasts:        Right: No mass, nipple discharge, skin change or tenderness.        Left: No mass, nipple discharge, skin change or tenderness.  Abdominal:     General: Bowel sounds are normal.     Palpations: Abdomen is soft.     Tenderness: There is no abdominal tenderness.  Musculoskeletal:     Cervical back: Normal range of motion. No erythema.     Right lower leg: No edema.     Left lower leg: No edema.  Lymphadenopathy:     Cervical: No cervical adenopathy.  Skin:    General: Skin is warm and dry.     Capillary Refill: Capillary refill takes less than 2 seconds.     Findings: No rash.  Neurological:     General: No focal  deficit present.     Mental Status: She is alert and oriented to person, place, and time.     Cranial Nerves: No cranial nerve deficit.     Sensory: No sensory deficit.     Deep Tendon Reflexes: Reflexes are normal and symmetric.  Psychiatric:        Attention and Perception: Attention normal.        Mood and Affect: Mood normal.     Wt Readings from Last 3 Encounters:  12/04/19 180 lb (81.6 kg)  12/26/18 176 lb (79.8 kg)  01/05/18 173 lb (78.5 kg)    BP 140/74   Pulse (!) 107  Temp 98.1 F (36.7 C) (Oral)   Ht 5\' 1"  (1.549 m)   Wt 180 lb (81.6 kg)   SpO2 95%   BMI 34.01 kg/m   Assessment and Plan: 1. Annual physical exam Exam is normal except for weight. Encourage regular exercise and appropriate dietary changes. - CBC with Differential/Platelet - TSH - Hemoglobin A1c  2. Encounter for screening mammogram for breast cancer To be scheduled at [place]; Future  3. Need for hepatitis C screening test - Hepatitis C antibody  4. Hyperlipidemia, mild On statin therapy without side effects or other concerns - Comprehensive metabolic panel - Lipid panel  Pt will consider Shingrix vaccine and call for nurse visit if desired. Partially dictated using Editor, commissioning. Any errors are unintentional.  Halina Maidens, MD [facility]  12/04/2019

## 2019-12-05 LAB — COMPREHENSIVE METABOLIC PANEL
ALT: 23 IU/L (ref 0–32)
AST: 22 IU/L (ref 0–40)
Albumin/Globulin Ratio: 1.8 (ref 1.2–2.2)
Albumin: 4.6 g/dL (ref 3.8–4.8)
Alkaline Phosphatase: 78 IU/L (ref 44–121)
BUN/Creatinine Ratio: 18 (ref 12–28)
BUN: 15 mg/dL (ref 8–27)
Bilirubin Total: 0.4 mg/dL (ref 0.0–1.2)
CO2: 19 mmol/L — ABNORMAL LOW (ref 20–29)
Calcium: 9.7 mg/dL (ref 8.7–10.3)
Chloride: 104 mmol/L (ref 96–106)
Creatinine, Ser: 0.84 mg/dL (ref 0.57–1.00)
GFR calc Af Amer: 86 mL/min/{1.73_m2} (ref >59)
GFR calc non Af Amer: 75 mL/min/{1.73_m2} (ref >59)
Globulin, Total: 2.6 g/dL (ref 1.5–4.5)
Glucose: 126 mg/dL — ABNORMAL HIGH (ref 65–99)
Potassium: 4.6 mmol/L (ref 3.5–5.2)
Sodium: 140 mmol/L (ref 134–144)
Total Protein: 7.2 g/dL (ref 6.0–8.5)

## 2019-12-05 LAB — CBC WITH DIFFERENTIAL/PLATELET
Basophils Absolute: 0.1 10*3/uL (ref 0.0–0.2)
Basos: 1 %
EOS (ABSOLUTE): 0.2 10*3/uL (ref 0.0–0.4)
Eos: 4 %
Hematocrit: 47.5 % — ABNORMAL HIGH (ref 34.0–46.6)
Hemoglobin: 16.1 g/dL — ABNORMAL HIGH (ref 11.1–15.9)
Immature Grans (Abs): 0 10*3/uL (ref 0.0–0.1)
Immature Granulocytes: 1 %
Lymphocytes Absolute: 2.1 10*3/uL (ref 0.7–3.1)
Lymphs: 34 %
MCH: 30.7 pg (ref 26.6–33.0)
MCHC: 33.9 g/dL (ref 31.5–35.7)
MCV: 91 fL (ref 79–97)
Monocytes Absolute: 0.6 10*3/uL (ref 0.1–0.9)
Monocytes: 9 %
Neutrophils Absolute: 3.3 10*3/uL (ref 1.4–7.0)
Neutrophils: 51 %
Platelets: 231 10*3/uL (ref 150–450)
RBC: 5.25 x10E6/uL (ref 3.77–5.28)
RDW: 12.5 % (ref 11.7–15.4)
WBC: 6.3 10*3/uL (ref 3.4–10.8)

## 2019-12-05 LAB — LIPID PANEL
Chol/HDL Ratio: 3.9 ratio (ref 0.0–4.4)
Cholesterol, Total: 215 mg/dL — ABNORMAL HIGH (ref 100–199)
HDL: 55 mg/dL (ref >39)
LDL Chol Calc (NIH): 114 mg/dL — ABNORMAL HIGH (ref 0–99)
Triglycerides: 266 mg/dL — ABNORMAL HIGH (ref 0–149)
VLDL Cholesterol Cal: 46 mg/dL — ABNORMAL HIGH (ref 5–40)

## 2019-12-05 LAB — HEPATITIS C ANTIBODY: Hep C Virus Ab: 0.2 s/co ratio (ref 0.0–0.9)

## 2019-12-05 LAB — TSH: TSH: 2.02 u[IU]/mL (ref 0.450–4.500)

## 2019-12-05 LAB — HEMOGLOBIN A1C
Est. average glucose Bld gHb Est-mCnc: 117 mg/dL
Hgb A1c MFr Bld: 5.7 % — ABNORMAL HIGH (ref 4.8–5.6)

## 2020-01-02 ENCOUNTER — Other Ambulatory Visit: Payer: Self-pay

## 2020-01-02 ENCOUNTER — Encounter: Payer: Self-pay | Admitting: Internal Medicine

## 2020-01-02 ENCOUNTER — Ambulatory Visit: Payer: BC Managed Care – PPO | Admitting: Internal Medicine

## 2020-01-02 VITALS — BP 128/92 | HR 96 | Temp 98.0°F | Ht 61.0 in | Wt 180.0 lb

## 2020-01-02 DIAGNOSIS — J019 Acute sinusitis, unspecified: Secondary | ICD-10-CM | POA: Diagnosis not present

## 2020-01-02 MED ORDER — AMOXICILLIN-POT CLAVULANATE 875-125 MG PO TABS: 1.0000 | ORAL_TABLET | Freq: Two times a day (BID) | ORAL | 0 refills | Status: AC

## 2020-01-02 NOTE — Progress Notes (Signed)
Date:  01/02/2020   Name:  Elizabeth Frye   DOB:  [birth year]   MRN:  [id number]   Chief Complaint: Cough (X1 week, Runny nose, headache, no fever, throat sore when drinking)  Sinusitis This is a new problem. The current episode started in the past 7 days. The problem is unchanged. There has been no fever. She is experiencing no pain. Associated symptoms include congestion, coughing, sinus pressure and a sore throat. Pertinent negatives include no chills, diaphoresis, headaches or shortness of breath.    Lab Results  Component Value Date   CREATININE 0.84 12/04/2019   BUN 15 12/04/2019   NA 140 12/04/2019   K 4.6 12/04/2019   CL 104 12/04/2019   CO2 19 (L) 12/04/2019   Lab Results  Component Value Date   CHOL 215 (H) 12/04/2019   HDL 55 12/04/2019   LDLCALC 114 (H) 12/04/2019   TRIG 266 (H) 12/04/2019   CHOLHDL 3.9 12/04/2019   Lab Results  Component Value Date   TSH 2.020 12/04/2019   Lab Results  Component Value Date   HGBA1C 5.7 (H) 12/04/2019   Lab Results  Component Value Date   WBC 6.3 12/04/2019   HGB 16.1 (H) 12/04/2019   HCT 47.5 (H) 12/04/2019   MCV 91 12/04/2019   PLT 231 12/04/2019   Lab Results  Component Value Date   ALT 23 12/04/2019   AST 22 12/04/2019   ALKPHOS 78 12/04/2019   BILITOT 0.4 12/04/2019     Review of Systems  Constitutional: Negative for chills, diaphoresis, fatigue and fever.  HENT: Positive for congestion, postnasal drip, sinus pressure and sore throat. Negative for trouble swallowing.   Respiratory: Positive for cough. Negative for chest tightness, shortness of breath and wheezing.   Cardiovascular: Negative for chest pain and palpitations.  Neurological: Negative for dizziness and headaches.    Patient Active Problem List   Diagnosis Date Noted  . Hyperlipidemia, mild 12/26/2018  . BMI 33.0-33.9,adult 12/26/2018  . Benign neoplasm of cecum   . Benign neoplasm of transverse colon     Allergies  Allergen  Reactions  . Sulfa Antibiotics Rash    Past Surgical History:  Procedure Laterality Date  . ABDOMINAL HYSTERECTOMY     still have cervix and one ovary  . BREAST CYST EXCISION Right    neg  . COLONOSCOPY WITH PROPOFOL N/A 01/05/2018   Procedure: COLONOSCOPY WITH PROPOFOL;  Surgeon: Lucilla Lame, MD;  Location: [place];  Service: Endoscopy;  Laterality: N/A;  . POLYPECTOMY  01/05/2018   Procedure: POLYPECTOMY INTESTINAL;  Surgeon: Lucilla Lame, MD;  Location: [place];  Service: Endoscopy;;  . PROLAPSED UTERINE FIBROID LIGATION      Social History   Tobacco Use  . Smoking status: Never Smoker  . Smokeless tobacco: Never Used  Vaping Use  . Vaping Use: Never used  Substance Use Topics  . Alcohol use: Yes    Alcohol/week: 3.0 standard drinks    Types: 3 Glasses of wine per week  . Drug use: Never     Medication list has been reviewed and updated.  Current Meds  Medication Sig  . Multiple Vitamins-Minerals (WOMENS MULTIVITAMIN PO) Take by mouth.  . Potassium 75 MG TABS Take by mouth.  . simvastatin (ZOCOR) 40 MG tablet Take 1 tablet (40 mg total) by mouth daily.    PHQ 2/9 Scores 01/02/2020 12/04/2019 12/26/2018  PHQ - 2 Score 0 0 0  PHQ- 9 Score 0 0 -  GAD 7 : Generalized Anxiety Score 01/02/2020 12/04/2019  Nervous, Anxious, on Edge 0 0  Control/stop worrying 0 0  Worry too much - different things 0 0  Trouble relaxing 0 0  Restless 0 0  Easily annoyed or irritable 0 0  Afraid - awful might happen 0 0  Total GAD 7 Score 0 0    BP Readings from Last 3 Encounters:  01/02/20 (!) 128/92  12/04/19 124/82  12/26/18 132/90    Physical Exam Constitutional:      Appearance: Normal appearance. She is well-developed and well-nourished.  HENT:     Right Ear: Ear canal and external ear normal. Tympanic membrane is not erythematous or retracted.     Left Ear: Ear canal and external ear normal. Tympanic membrane is not erythematous or  retracted.     Nose:     Right Sinus: Maxillary sinus tenderness present. No frontal sinus tenderness.     Left Sinus: Maxillary sinus tenderness present. No frontal sinus tenderness.     Mouth/Throat:     Mouth: Mucous membranes are normal. No oral lesions.     Pharynx: Uvula midline. Posterior oropharyngeal erythema present. No oropharyngeal exudate.  Cardiovascular:     Rate and Rhythm: Normal rate and regular rhythm.     Heart sounds: Normal heart sounds.  Pulmonary:     Breath sounds: Normal breath sounds. No wheezing or rales.  Musculoskeletal:     Cervical back: Normal range of motion.  Lymphadenopathy:     Cervical: No cervical adenopathy.  Neurological:     Mental Status: She is alert and oriented to person, place, and time.     Wt Readings from Last 3 Encounters:  01/02/20 180 lb (81.6 kg)  12/04/19 180 lb (81.6 kg)  12/26/18 176 lb (79.8 kg)    BP (!) 128/92   Pulse 96   Temp 98 F (36.7 C) (Oral)   Ht 5\' 1"  (1.549 m)   Wt 180 lb (81.6 kg)   SpO2 95%   BMI 34.01 kg/m   Assessment and Plan: 1. Acute non-recurrent sinusitis, unspecified location Continue over the counter sinus medications Cough drops if needed, fluids, rest - amoxicillin-clavulanate (AUGMENTIN) 875-125 MG tablet; Take 1 tablet by mouth 2 (two) times daily for 10 days.  Dispense: 20 tablet; Refill: 0   Partially dictated using Editor, commissioning. Any errors are unintentional.  Halina Maidens, MD [facility]  01/02/2020

## 2020-01-14 ENCOUNTER — Ambulatory Visit
Admission: RE | Admit: 2020-01-14 | Discharge: 2020-01-14 | Disposition: A | Discharge status: Home / Self Care | Payer: BC Managed Care – PPO | Source: Ambulatory Visit | Attending: Internal Medicine | Admitting: Internal Medicine

## 2020-01-14 ENCOUNTER — Other Ambulatory Visit: Payer: Self-pay

## 2020-01-14 DIAGNOSIS — Z1231 Encounter for screening mammogram for malignant neoplasm of breast: Secondary | ICD-10-CM

## 2020-10-23 ENCOUNTER — Other Ambulatory Visit: Payer: Self-pay

## 2020-10-23 ENCOUNTER — Ambulatory Visit: Payer: Self-pay

## 2020-10-23 DIAGNOSIS — M25511 Pain in right shoulder: Secondary | ICD-10-CM

## 2020-10-23 NOTE — Telephone Encounter (Signed)
The patient shares that they've experienced muscle soreness for roughly two weeks   The patient shares they have experienced sleep disruption due to their discomfort   The patient describes the soreness as significant and shares that is is primarily in their left shoulder as well as their side and back   The patient shares that it is uncomfortable for them to lift their arms above their head   Please contact to further discuss  Pt. Reports 2 weeks ago she did a lot of yard work and " may have over done it." Both shoulders hurt and she has difficulty raising her arms above her head. Hurts worse at night when in bed. No availability until next Wednesday. Asking to be seen sooner. Please advise.   Reason for Disposition  [1] MODERATE pain (e.g., interferes with normal activities) AND [2] present > 3 days  Answer Assessment - Initial Assessment Questions 1. ONSET: "When did the pain start?"     2 weeks ago 2. LOCATION: "Where is the pain located?"     Both shoulders 3. PAIN: "How bad is the pain?" (Scale 1-10; or mild, moderate, severe)   - MILD (1-3): doesn't interfere with normal activities   - MODERATE (4-7): interferes with normal activities (e.g., work or school) or awakens from sleep   - SEVERE (8-10): excruciating pain, unable to do any normal activities, unable to move arm at all due to pain     At night - 6 4. WORK OR EXERCISE: "Has there been any recent work or exercise that involved this part of the body?"     No 5. CAUSE: "What do you think is causing the shoulder pain?"     Over use 6. OTHER SYMPTOMS: "Do you have any other symptoms?" (e.g., neck pain, swelling, rash, fever, numbness, weakness)     Neck 7. PREGNANCY: "Is there any chance you are pregnant?" "When was your last menstrual period?"     No  Protocols used: Shoulder Pain-A-AH

## 2020-10-23 NOTE — Telephone Encounter (Signed)
Spoke with patient and she has agreed to see Dr. Zigmund Daniel for bilateral shoulder pain and limited ROM.  Will obtain bilateral shoulder X-rays prior to appointment.

## 2020-10-27 ENCOUNTER — Ambulatory Visit
Admission: RE | Admit: 2020-10-27 | Discharge: 2020-10-27 | Disposition: A | Discharge status: Home / Self Care | Payer: BC Managed Care – PPO | Attending: Internal Medicine | Admitting: Internal Medicine

## 2020-10-27 ENCOUNTER — Ambulatory Visit: Payer: BC Managed Care – PPO | Admitting: Family Medicine

## 2020-10-27 ENCOUNTER — Encounter: Payer: Self-pay | Admitting: Family Medicine

## 2020-10-27 ENCOUNTER — Ambulatory Visit
Admission: RE | Admit: 2020-10-27 | Discharge: 2020-10-27 | Disposition: A | Discharge status: Home / Self Care | Payer: BC Managed Care – PPO | Source: Ambulatory Visit | Attending: Internal Medicine | Admitting: Internal Medicine

## 2020-10-27 ENCOUNTER — Other Ambulatory Visit: Payer: Self-pay

## 2020-10-27 VITALS — BP 138/94 | HR 98 | Ht 61.0 in | Wt 184.0 lb

## 2020-10-27 DIAGNOSIS — M7542 Impingement syndrome of left shoulder: Secondary | ICD-10-CM | POA: Diagnosis not present

## 2020-10-27 DIAGNOSIS — M62838 Other muscle spasm: Secondary | ICD-10-CM

## 2020-10-27 DIAGNOSIS — M25511 Pain in right shoulder: Secondary | ICD-10-CM | POA: Diagnosis present

## 2020-10-27 DIAGNOSIS — M25512 Pain in left shoulder: Secondary | ICD-10-CM | POA: Diagnosis present

## 2020-10-27 DIAGNOSIS — M7541 Impingement syndrome of right shoulder: Secondary | ICD-10-CM

## 2020-10-27 HISTORY — DX: Impingement syndrome of left shoulder: M75.42

## 2020-10-27 HISTORY — DX: Impingement syndrome of right shoulder: M75.41

## 2020-10-27 MED ORDER — CYCLOBENZAPRINE HCL 5 MG PO TABS: 5.0000 mg | ORAL_TABLET | Freq: Every evening | ORAL | 0 refills | Status: DC | PRN

## 2020-10-27 MED ORDER — DICLOFENAC SODIUM 50 MG PO TBEC: 50.0000 mg | DELAYED_RELEASE_TABLET | Freq: Two times a day (BID) | ORAL | 0 refills | Status: DC

## 2020-10-27 NOTE — Assessment & Plan Note (Signed)
See additional assessment(s) for plan details. 

## 2020-10-27 NOTE — Patient Instructions (Signed)
-   Start diclofenac 50 mg every a.m. and every p.m. scheduled (take with food, no other NSAIDs while on this medication) - Can dose nightly muscle relaxer cyclobenzaprine 5 mg on an as-needed basis for muscle tightness pain, side effect can be drowsiness - Recommend moist heat alternating with ice at 20-minute intervals for additional symptom control - Avoid strenuous and repetitive activity, and shoulder symptoms as a guide - Contact our office by Friday a.m. if symptoms have failed to improve whatsoever, we will discuss next steps - Otherwise, return for follow-up in 2 weeks, contact us for any questions between now and then

## 2020-10-27 NOTE — Progress Notes (Signed)
Primary Care / Sports Medicine Office Visit  Patient Information:  Patient ID: Elizabeth Frye, female DOB: October 07, 1955 Age: 65 y.o. MRN: [id number]   Elizabeth Frye is a pleasant 65 y.o. female presenting with the following:  Chief Complaint  Patient presents with   New Patient (Initial Visit)   Shoulder Pain    Bilateral; left>right; X-Rays today, 10/27/20; x3 weeks; started after getting leaves out of her pool and pulling weeds; limited ROM; neck stiffness associated; has tried stretching, heat, and ibuprofen with no relief; 8/10 pain    Review of Systems pertinent details above   Patient Active Problem List   Diagnosis Date Noted   Rotator cuff impingement syndrome of right shoulder 10/27/2020   Rotator cuff impingement syndrome, left 10/27/2020   Cervical paraspinal muscle spasm 10/27/2020   Hyperlipidemia, mild 12/26/2018   BMI 33.0-33.9,adult 12/26/2018   Benign neoplasm of cecum    Benign neoplasm of transverse colon    Past Medical History:  Diagnosis Date   DVT, lower extremity ([place]) 1997   after injury to leg   History of pulmonary embolus ([place]) 1997   Hyperlipidemia    Outpatient Encounter Medications as of 10/27/2020  Medication Sig   cyclobenzaprine (FLEXERIL) 5 MG tablet Take 1 tablet (5 mg total) by mouth at bedtime as needed for muscle spasms.   diclofenac (VOLTAREN) 50 MG EC tablet Take 1 tablet (50 mg total) by mouth 2 (two) times daily.   POTASSIUM PO Take 1 tablet by mouth daily.   simvastatin (ZOCOR) 40 MG tablet Take 1 tablet (40 mg total) by mouth daily.   [DISCONTINUED] ibuprofen (ADVIL) 200 MG tablet Take 600-800 mg by mouth at bedtime.   [DISCONTINUED] Multiple Vitamins-Minerals (WOMENS MULTIVITAMIN PO) Take by mouth.   No facility-administered encounter medications on file as of 10/27/2020.   Past Surgical History:  Procedure Laterality Date   ABDOMINAL HYSTERECTOMY     still have cervix and one ovary   BREAST CYST EXCISION Right     neg   COLONOSCOPY WITH PROPOFOL N/A 01/05/2018   Procedure: COLONOSCOPY WITH PROPOFOL;  Surgeon: Lucilla Lame, MD;  Location: [place];  Service: Endoscopy;  Laterality: N/A;   POLYPECTOMY  01/05/2018   Procedure: POLYPECTOMY INTESTINAL;  Surgeon: Lucilla Lame, MD;  Location: [facility];  Service: Endoscopy;;   PROLAPSED UTERINE FIBROID LIGATION      Vitals:   10/27/20 1059  BP: (!) 138/94  Pulse: 98  SpO2: 97%   Vitals:   10/27/20 1059  Weight: 184 lb (83.5 kg)  Height: 5\' 1"  (1.549 m)   Body mass index is 34.77 kg/m.  No results found.   Independent interpretation of notes and tests performed by another provider:   Independent interpretation of bilateral shoulder x-rays dated 10/27/2020 reveal mild glenohumeral osteoarthritis bilaterally, right greater than left, mild acromioclavicular arthritis on the right, no acute osseous process identified  Procedures performed:   None  Pertinent History, Exam, Impression, and Recommendations:   Rotator cuff impingement syndrome of right shoulder Patient with 3-week history of bilateral, left greater than right lateral shoulder pain without radiation, atraumatic in onset but noted after yard work.  She has since noted tightness and stiffness around the neck, denies any paresthesias in the upper extremities, does note nighttime pain, and discomfort with activities of daily living.  She denies any similar episodes of the symptoms in the past.  Examination consistent with diffuse rotator cuff involvement with no significant deficits in strength,  maximal focality to the supraspinatus, and positive impingement findings.  There is asymmetric right greater than left paraspinal cervical spasm and negative Spurling's testing.  Findings are most consistent with bilateral rotator cuff related impingement and secondary/compensatory cervical paraspinal spasm.  I have reviewed the same with the patient as well as treatment  strategies.  She does have an upcoming cruise this weekend, we will start scheduled diclofenac 50 mg twice daily x2 weeks, as needed nightly cyclobenzaprine 5 mg, and I have encouraged her to contact our office this Friday for any recalcitrant symptoms, if noted we can work the patient in to discuss escalation of treatments such as alternative oral agents and/or ultrasound-guided injections.  Rotator cuff impingement syndrome, left See additional assessment(s) for plan details.    Orders & Medications Meds ordered this encounter  Medications   diclofenac (VOLTAREN) 50 MG EC tablet    Sig: Take 1 tablet (50 mg total) by mouth 2 (two) times daily.    Dispense:  30 tablet    Refill:  0   cyclobenzaprine (FLEXERIL) 5 MG tablet    Sig: Take 1 tablet (5 mg total) by mouth at bedtime as needed for muscle spasms.    Dispense:  14 tablet    Refill:  0   No orders of the defined types were placed in this encounter.    Return in about 2 weeks (around 11/10/2020).     Montel Culver, MD   Primary Care Sports Medicine [place]

## 2020-10-27 NOTE — Assessment & Plan Note (Signed)
Patient with 3-week history of bilateral, left greater than right lateral shoulder pain without radiation, atraumatic in onset but noted after yard work.  She has since noted tightness and stiffness around the neck, denies any paresthesias in the upper extremities, does note nighttime pain, and discomfort with activities of daily living.  She denies any similar episodes of the symptoms in the past.  Examination consistent with diffuse rotator cuff involvement with no significant deficits in strength, maximal focality to the supraspinatus, and positive impingement findings.  There is asymmetric right greater than left paraspinal cervical spasm and negative Spurling's testing.  Findings are most consistent with bilateral rotator cuff related impingement and secondary/compensatory cervical paraspinal spasm.  I have reviewed the same with the patient as well as treatment strategies.  She does have an upcoming cruise this weekend, we will start scheduled diclofenac 50 mg twice daily x2 weeks, as needed nightly cyclobenzaprine 5 mg, and I have encouraged her to contact our office this Friday for any recalcitrant symptoms, if noted we can work the patient in to discuss escalation of treatments such as alternative oral agents and/or ultrasound-guided injections.

## 2020-10-30 ENCOUNTER — Telehealth: Payer: Self-pay | Admitting: Family Medicine

## 2020-10-30 ENCOUNTER — Encounter: Payer: Self-pay | Admitting: Family Medicine

## 2020-10-30 ENCOUNTER — Ambulatory Visit (INDEPENDENT_AMBULATORY_CARE_PROVIDER_SITE_OTHER): Payer: BC Managed Care – PPO | Admitting: Family Medicine

## 2020-10-30 ENCOUNTER — Other Ambulatory Visit: Payer: Self-pay

## 2020-10-30 ENCOUNTER — Inpatient Hospital Stay (INDEPENDENT_AMBULATORY_CARE_PROVIDER_SITE_OTHER): Payer: BC Managed Care – PPO | Admitting: Radiology

## 2020-10-30 VITALS — BP 144/90 | HR 78 | Ht 61.0 in | Wt 185.0 lb

## 2020-10-30 DIAGNOSIS — M7541 Impingement syndrome of right shoulder: Secondary | ICD-10-CM

## 2020-10-30 DIAGNOSIS — M7542 Impingement syndrome of left shoulder: Secondary | ICD-10-CM

## 2020-10-30 DIAGNOSIS — M62838 Other muscle spasm: Secondary | ICD-10-CM

## 2020-10-30 MED ORDER — BACLOFEN 10 MG PO TABS: 10.0000 mg | ORAL_TABLET | Freq: Every evening | ORAL | 0 refills | Status: DC | PRN

## 2020-10-30 MED ORDER — LIDOCAINE HCL (PF) 1 % IJ SOLN
2.0000 mL | Freq: Once | INTRAMUSCULAR | Status: AC
  Administered 2020-10-30: 2 mL

## 2020-10-30 MED ORDER — MELOXICAM 15 MG PO TABS: 15.0000 mg | ORAL_TABLET | Freq: Every day | ORAL | 0 refills | Status: DC | PRN

## 2020-10-30 MED ORDER — TRIAMCINOLONE ACETONIDE 40 MG/ML IJ SUSP
40.0000 mg | Freq: Once | INTRAMUSCULAR | Status: AC
  Administered 2020-10-30: 40 mg via INTRAMUSCULAR

## 2020-10-30 MED ORDER — BUPIVACAINE HCL (PF) 0.5 % IJ SOLN
2.0000 mL | Freq: Once | INTRAMUSCULAR | Status: AC
  Administered 2020-10-30: 2 mL

## 2020-10-30 NOTE — Assessment & Plan Note (Signed)
See additional assessment(s) for plan details. 

## 2020-10-30 NOTE — Progress Notes (Signed)
Primary Care / Sports Medicine Office Visit  Patient Information:  Patient ID: Elizabeth Frye, female DOB: December 19, 1955 Age: 65 y.o. MRN: [id number]   Elizabeth Frye is a pleasant 65 y.o. female presenting with the following:  Chief Complaint  Patient presents with   Shoulder Pain    Bilateral, pain has not responded to initial treatments, upcoming travel. Leaving for cruise tomorrow.    Review of Systems pertinent details above   Patient Active Problem List   Diagnosis Date Noted   Rotator cuff impingement syndrome of right shoulder 10/27/2020   Rotator cuff impingement syndrome, left 10/27/2020   Cervical paraspinal muscle spasm 10/27/2020   Hyperlipidemia, mild 12/26/2018   BMI 33.0-33.9,adult 12/26/2018   Benign neoplasm of cecum    Benign neoplasm of transverse colon    Past Medical History:  Diagnosis Date   DVT, lower extremity ([place]) 1997   after injury to leg   History of pulmonary embolus (PE) 1997   Hyperlipidemia    Outpatient Encounter Medications as of 10/30/2020  Medication Sig   baclofen (LIORESAL) 10 MG tablet Take 1 tablet (10 mg total) by mouth at bedtime as needed for muscle spasms.   meloxicam (MOBIC) 15 MG tablet Take 1 tablet (15 mg total) by mouth daily as needed for pain.   POTASSIUM PO Take 1 tablet by mouth daily.   simvastatin (ZOCOR) 40 MG tablet Take 1 tablet (40 mg total) by mouth daily.   [DISCONTINUED] cyclobenzaprine (FLEXERIL) 5 MG tablet Take 1 tablet (5 mg total) by mouth at bedtime as needed for muscle spasms.   [DISCONTINUED] diclofenac (VOLTAREN) 50 MG EC tablet Take 1 tablet (50 mg total) by mouth 2 (two) times daily.   [EXPIRED] bupivacaine (MARCAINE) 0.5 % injection 2 mL    [EXPIRED] bupivacaine (MARCAINE) 0.5 % injection 2 mL    [EXPIRED] lidocaine (PF) (XYLOCAINE) 1 % injection 2 mL    [EXPIRED] lidocaine (PF) (XYLOCAINE) 1 % injection 2 mL    [EXPIRED] triamcinolone acetonide (KENALOG-40) injection 40 mg    [EXPIRED]  triamcinolone acetonide (KENALOG-40) injection 40 mg    No facility-administered encounter medications on file as of 10/30/2020.   Past Surgical History:  Procedure Laterality Date   ABDOMINAL HYSTERECTOMY     still have cervix and one ovary   BREAST CYST EXCISION Right    neg   COLONOSCOPY WITH PROPOFOL N/A 01/05/2018   Procedure: COLONOSCOPY WITH PROPOFOL;  Surgeon: Lucilla Lame, MD;  Location: [facility];  Service: Endoscopy;  Laterality: N/A;   POLYPECTOMY  01/05/2018   Procedure: POLYPECTOMY INTESTINAL;  Surgeon: Lucilla Lame, MD;  Location: [facility];  Service: Endoscopy;;   PROLAPSED UTERINE FIBROID LIGATION      Vitals:   10/30/20 1119  BP: (!) 144/90  Pulse: 78  SpO2: 97%   Vitals:   10/30/20 1119  Weight: 185 lb (83.9 kg)  Height: 5\' 1"  (1.549 m)   Body mass index is 34.96 kg/m.  DG Shoulder Right  Result Date: 10/28/2020 CLINICAL DATA:  Right shoulder pain for 3 weeks following yard work, initial encounter EXAM: RIGHT SHOULDER - 2+ VIEW COMPARISON:  None. FINDINGS: Degenerative changes of the acromioclavicular joint and glenohumeral joint are seen. No acute fracture or dislocation is noted. Underlying bony thorax appears within normal limits. No soft tissue changes are seen. IMPRESSION: Degenerative change without acute abnormality. Electronically Signed   By: Inez Catalina M.D.   On: 10/28/2020 01:16   DG Shoulder Left  Result Date: 10/28/2020 CLINICAL DATA:  Left shoulder pain for several weeks following yard work, initial encounter EXAM: LEFT SHOULDER - 2+ VIEW COMPARISON:  None. FINDINGS: No acute fracture or dislocation is noted. Mild degenerative changes of the acromioclavicular joint are seen. No soft tissue abnormality is noted. IMPRESSION: Degenerative change without acute abnormality. Electronically Signed   By: Inez Catalina M.D.   On: 10/28/2020 01:17     Independent interpretation of notes and tests performed by another provider:    None  Procedures performed:   Procedure:  Injection of right subacromial shoulder space under ultrasound guidance. Ultrasound guidance utilized for visualization of supraspinatus, confirmation of injectate Samsung HS60 device utilized with permanent recording / reporting. Consent obtained and verified. Skin prepped in a sterile fashion. Ethyl chloride spray for topical local analgesia.  Completed without difficulty and tolerated well. Medication: triamcinolone acetonide 40 mg/mL suspension for injection 1 mL total and 2 mL lidocaine 1% without epinephrine utilized for needle placement anesthetic Advised to contact for fevers/chills, erythema, induration, drainage, or persistent bleeding.   Procedure:  Injection of left subacromial shoulder space under ultrasound guidance. Ultrasound guidance utilized for visualization of the supraspinatus, hypoechoic response from injectate noted Samsung HS60 device utilized with permanent recording / reporting. Consent obtained and verified. Skin prepped in a sterile fashion. Ethyl chloride spray for topical local analgesia.  Completed without difficulty and tolerated well. Medication: triamcinolone acetonide 40 mg/mL suspension for injection 1 mL total and 2 mL lidocaine 1% without epinephrine utilized for needle placement anesthetic Advised to contact for fevers/chills, erythema, induration, drainage, or persistent bleeding.   Pertinent History, Exam, Impression, and Recommendations:   Rotator cuff impingement syndrome of right shoulder Patient presents for hypertension follow-up citing lack of a response from initial pharmacotherapy for bilateral rotator cuff related impingement.  I did advise patient to contact us if this were the case given her upcoming travel (cruise).  Physical examination findings show persistent focality to primarily the supraspinatus, secondarily to the external rotators bilaterally, she has positive impingement, comorbid  paraspinal cervical symmetrical spasm without radicular features.  Given her persistent symptomatology without improvement despite diclofenac and cyclobenzaprine, I did discuss additional treatments and she did elect to proceed with bilateral ultrasound-guided subacromial corticosteroid injections.  I did review the timeline for anticipated symptom response, she will dose of meloxicam x3 days then as needed and baclofen x3 nights then as needed as an adjunct to these injections given her comorbid cervical paraspinal spasm.  She is to contact us for any questions while away and otherwise maintain follow-up as previously scheduled.  Pending symptoms at return, home-based versus formal rehab therapy to be considered versus advanced imaging.  Patient is understanding and amenable to this plan moving forward.  Rotator cuff impingement syndrome, left See additional assessment(s) for plan details.  Cervical paraspinal muscle spasm See additional assessment(s) for plan details.   Orders & Medications Meds ordered this encounter  Medications   bupivacaine (MARCAINE) 0.5 % injection 2 mL   lidocaine (PF) (XYLOCAINE) 1 % injection 2 mL   triamcinolone acetonide (KENALOG-40) injection 40 mg   bupivacaine (MARCAINE) 0.5 % injection 2 mL   lidocaine (PF) (XYLOCAINE) 1 % injection 2 mL   triamcinolone acetonide (KENALOG-40) injection 40 mg   meloxicam (MOBIC) 15 MG tablet    Sig: Take 1 tablet (15 mg total) by mouth daily as needed for pain.    Dispense:  14 tablet    Refill:  0   baclofen (LIORESAL) 10 MG  tablet    Sig: Take 1 tablet (10 mg total) by mouth at bedtime as needed for muscle spasms.    Dispense:  14 each    Refill:  0   Orders Placed This Encounter  Procedures   [place] LIMITED JOINT SPACE STRUCTURES UP BILAT     No follow-ups on file.     Montel Culver, MD   Primary Care Sports Medicine [place]

## 2020-10-30 NOTE — Telephone Encounter (Signed)
Patient called and stated that Dr. Zigmund Daniel suggested her calling back in if her pain continues in her shoulder. I explained to the patient that Dr. Zigmund Daniel has a full schedule today but could see her on Monday morning. Patient stated that Monday she will be going on a cruise and Dr. Zigmund Daniel told her that his schedule would be full and he would make room for her to come in.

## 2020-10-30 NOTE — Assessment & Plan Note (Signed)
Patient presents for hypertension follow-up citing lack of a response from initial pharmacotherapy for bilateral rotator cuff related impingement.  I did advise patient to contact us if this were the case given her upcoming travel (cruise).  Physical examination findings show persistent focality to primarily the supraspinatus, secondarily to the external rotators bilaterally, she has positive impingement, comorbid paraspinal cervical symmetrical spasm without radicular features.  Given her persistent symptomatology without improvement despite diclofenac and cyclobenzaprine, I did discuss additional treatments and she did elect to proceed with bilateral ultrasound-guided subacromial corticosteroid injections.  I did review the timeline for anticipated symptom response, she will dose of meloxicam x3 days then as needed and baclofen x3 nights then as needed as an adjunct to these injections given her comorbid cervical paraspinal spasm.  She is to contact us for any questions while away and otherwise maintain follow-up as previously scheduled.  Pending symptoms at return, home-based versus formal rehab therapy to be considered versus advanced imaging.  Patient is understanding and amenable to this plan moving forward.

## 2020-10-30 NOTE — Telephone Encounter (Signed)
Patient scheduled for today for injection at 1120.

## 2020-10-30 NOTE — Patient Instructions (Addendum)
You have just been given a cortisone injection to reduce pain and inflammation. After the injection you may notice immediate relief of pain as a result of the Lidocaine. It is important to rest the area of the injection for 24 to 48 hours after the injection. There is a possibility of some temporary increased discomfort and swelling for up to 72 hours until the cortisone begins to work. If you do have pain, simply rest the joint and use ice. If you can tolerate over the counter medications, you can try Tylenol, Aleve, or Advil for added relief per package instructions. - Stop previous medications - Start once daily meloxicam (take with food) x3 days then once daily as needed - Start nightly baclofen (side effect can be drowsiness) x3 nights then nightly as needed for muscle tightness - If previously dosed muscle relaxer (cyclobenzaprine) was more effective, can stop baclofen and restart cyclobenzaprine - Keep scheduled follow-up, contact us for any questions between now and then

## 2020-11-02 ENCOUNTER — Ambulatory Visit: Payer: BC Managed Care – PPO | Admitting: Family Medicine

## 2020-11-10 ENCOUNTER — Other Ambulatory Visit: Payer: Self-pay

## 2020-11-10 ENCOUNTER — Ambulatory Visit: Payer: BC Managed Care – PPO | Admitting: Internal Medicine

## 2020-11-10 ENCOUNTER — Ambulatory Visit: Payer: BC Managed Care – PPO | Admitting: Family Medicine

## 2020-11-10 ENCOUNTER — Encounter: Payer: Self-pay | Admitting: Family Medicine

## 2020-11-10 VITALS — BP 124/92 | HR 95 | Wt 183.0 lb

## 2020-11-10 DIAGNOSIS — M7541 Impingement syndrome of right shoulder: Secondary | ICD-10-CM

## 2020-11-10 DIAGNOSIS — M7542 Impingement syndrome of left shoulder: Secondary | ICD-10-CM | POA: Diagnosis not present

## 2020-11-10 DIAGNOSIS — M62838 Other muscle spasm: Secondary | ICD-10-CM

## 2020-11-10 MED ORDER — MELOXICAM 15 MG PO TABS: 15.0000 mg | ORAL_TABLET | Freq: Every day | ORAL | 0 refills | Status: DC | PRN

## 2020-11-10 MED ORDER — DULOXETINE HCL 30 MG PO CPEP: ORAL_CAPSULE | ORAL | 0 refills | Status: DC

## 2020-11-10 NOTE — Assessment & Plan Note (Addendum)
Patient presents for follow-up to bilateral corticosteroid injections to the subacromial space on 10/30/2020.  She states that she noted total symptom resolution for roughly 1 week, following this pain has gradually began to return, no new symptomatology.  Examination today reveals overall improvement in pain during resisted rotator cuff testing though there is persistent focality to the supraspinatus, internal rotation lateral rotation benign today.  She does have positive impingement testing bilaterally.  I have advised patient the nature of her findings today, in regards to her overall clinical course and response from cortisone, she understands that there is focality to her rotator cuff and treatment for this can involve medications, injections, but also physical therapy.  She will start formal physical therapy, as an adjunct to this I have advised as needed meloxicam and cyclobenzaprine, initiation of duloxetine at 30 mg x 2 weeks then 60 mg until her return.  She is to return for follow-up in 6 weeks time for reevaluation.  If any recalcitrant symptomatology noted despite adherence to the above, can consider imaging of the cervical spine, advanced imaging of shoulder(s).

## 2020-11-10 NOTE — Progress Notes (Signed)
Primary Care / Sports Medicine Office Visit  Patient Information:  Patient ID: Elizabeth Frye, female DOB: [birth year] Age: 65 y.o. MRN: [id number]   Elizabeth Frye is a pleasant 65 y.o. female presenting with the following:  Chief Complaint  Patient presents with   Rotator cuff impingement syndrome of right shoulder    Cortisone injections bilaterally 10/30/20, tolerated well for about a week, but pain has returned; left shoulder pain is worse per patient; using heat and ice with some relief, taking meloxicam as needed, but only took baclofen once due to side effect of nocturia; 5/10 pain    Review of Systems pertinent details above   Patient Active Problem List   Diagnosis Date Noted   Rotator cuff impingement syndrome of right shoulder 10/27/2020   Rotator cuff impingement syndrome, left 10/27/2020   Cervical paraspinal muscle spasm 10/27/2020   Hyperlipidemia, mild 12/26/2018   BMI 33.0-33.9,adult 12/26/2018   Benign neoplasm of cecum    Benign neoplasm of transverse colon    Past Medical History:  Diagnosis Date   DVT, lower extremity ([place]) 1997   after injury to leg   History of pulmonary embolus (PE) 1997   Hyperlipidemia    Outpatient Encounter Medications as of 11/10/2020  Medication Sig   DULoxetine (CYMBALTA) 30 MG capsule Take 1 capsule (30 mg total) by mouth every evening for 14 days, THEN 2 capsules (60 mg total) every evening.   baclofen (LIORESAL) 10 MG tablet Take 1 tablet (10 mg total) by mouth at bedtime as needed for muscle spasms.   meloxicam (MOBIC) 15 MG tablet Take 1 tablet (15 mg total) by mouth daily as needed for pain.   POTASSIUM PO Take 1 tablet by mouth daily.   simvastatin (ZOCOR) 40 MG tablet Take 1 tablet (40 mg total) by mouth daily.   [DISCONTINUED] meloxicam (MOBIC) 15 MG tablet Take 1 tablet (15 mg total) by mouth daily as needed for pain.   No facility-administered encounter medications on file as of 11/10/2020.   Past  Surgical History:  Procedure Laterality Date   ABDOMINAL HYSTERECTOMY     still have cervix and one ovary   BREAST CYST EXCISION Right    neg   COLONOSCOPY WITH PROPOFOL N/A 01/05/2018   Procedure: COLONOSCOPY WITH PROPOFOL;  Surgeon: Lucilla Lame, MD;  Location: [facility];  Service: Endoscopy;  Laterality: N/A;   POLYPECTOMY  01/05/2018   Procedure: POLYPECTOMY INTESTINAL;  Surgeon: Lucilla Lame, MD;  Location: [facility];  Service: Endoscopy;;   PROLAPSED UTERINE FIBROID LIGATION      Vitals:   11/10/20 1050  BP: (!) 124/92  Pulse: 95  SpO2: 98%   Vitals:   11/10/20 1050  Weight: 183 lb (83 kg)   Body mass index is 34.58 kg/m.     Independent interpretation of notes and tests performed by another provider:   None  Procedures performed:   None  Pertinent History, Exam, Impression, and Recommendations:   Cervical paraspinal muscle spasm Incidentally noted, this in the setting of bilateral rotator cuff impingement syndrome.  She will be starting physical therapy and I have advised her to monitor this area for any symptom recurrence, if noted meloxicam and cyclobenzaprine can be dosed on an as-needed basis.  We will follow this issue at her return.  Rotator cuff impingement syndrome of right shoulder Patient presents for follow-up to bilateral corticosteroid injections to the subacromial space on 10/30/2020.  She states that she noted total symptom  resolution for roughly 1 week, following this pain has gradually began to return, no new symptomatology.  Examination today reveals overall improvement in pain during resisted rotator cuff testing though there is persistent focality to the supraspinatus, internal rotation lateral rotation benign today.  She does have positive impingement testing bilaterally.  I have advised patient the nature of her findings today, in regards to her overall clinical course and response from cortisone, she understands that  there is focality to her rotator cuff and treatment for this can involve medications, injections, but also physical therapy.  She will start formal physical therapy, as an adjunct to this I have advised as needed meloxicam and cyclobenzaprine, initiation of duloxetine at 30 mg x 2 weeks then 60 mg until her return.  She is to return for follow-up in 6 weeks time for reevaluation.  If any recalcitrant symptomatology noted despite adherence to the above, can consider imaging of the cervical spine, advanced imaging of shoulder(s).  Rotator cuff impingement syndrome, left See additional assessment(s) for plan details.   Orders & Medications Meds ordered this encounter  Medications   DULoxetine (CYMBALTA) 30 MG capsule    Sig: Take 1 capsule (30 mg total) by mouth every evening for 14 days, THEN 2 capsules (60 mg total) every evening.    Dispense:  74 capsule    Refill:  0   meloxicam (MOBIC) 15 MG tablet    Sig: Take 1 tablet (15 mg total) by mouth daily as needed for pain.    Dispense:  30 tablet    Refill:  0   Orders Placed This Encounter  Procedures   Ambulatory referral to Physical Therapy     Return in about 6 weeks (around 12/22/2020).     Montel Culver, MD   Primary Care Sports Medicine [facility]

## 2020-11-10 NOTE — Patient Instructions (Signed)
-   Continue meloxicam once daily as-needed basis (take with food) - Can dose nightly cyclobenzaprine (muscle relaxer) on an as-needed basis - Start duloxetine 30 mg capsule daily - After 2 weeks increase to 2 capsules (60 mg) daily - Start physical therapy and home exercises - Return for follow-up in 6 weeks, contact us for any questions between now and then

## 2020-11-10 NOTE — Assessment & Plan Note (Signed)
See additional assessment(s) for plan details. 

## 2020-11-10 NOTE — Assessment & Plan Note (Signed)
Incidentally noted, this in the setting of bilateral rotator cuff impingement syndrome.  She will be starting physical therapy and I have advised her to monitor this area for any symptom recurrence, if noted meloxicam and cyclobenzaprine can be dosed on an as-needed basis.  We will follow this issue at her return.

## 2020-11-11 ENCOUNTER — Telehealth: Payer: Self-pay

## 2020-11-11 ENCOUNTER — Encounter: Payer: Self-pay | Admitting: Internal Medicine

## 2020-11-11 ENCOUNTER — Telehealth (INDEPENDENT_AMBULATORY_CARE_PROVIDER_SITE_OTHER): Payer: BC Managed Care – PPO | Admitting: Internal Medicine

## 2020-11-11 VITALS — Temp 98.7°F | Ht 61.0 in | Wt 183.0 lb

## 2020-11-11 DIAGNOSIS — B342 Coronavirus infection, unspecified: Secondary | ICD-10-CM | POA: Diagnosis not present

## 2020-11-11 MED ORDER — MOLNUPIRAVIR EUA 200MG CAPSULE: 4.0000 | ORAL_CAPSULE | Freq: Two times a day (BID) | ORAL | 0 refills | Status: AC

## 2020-11-11 NOTE — Progress Notes (Signed)
Date:  11/11/2020   Name:  Elizabeth Frye   DOB:  1955/11/23   MRN:  [id number]   Chief Complaint: Covid Positive  I connected with  Elizabeth Frye on 11/11/20 by a video enabled telemedicine application and verified that I am speaking with the correct person using two identifiers. She is located at home.  I advised that I am speaking to her from a secure location in my office in [place], [place]. I discussed the limitations of evaluation and management by telemedicine. The patient expressed understanding and agreed to proceed.   Covid Positive: This is a new problem. Pt reports that she had an appt yesterday (11/10/20) with Dr. Zigmund Daniel and when she returned home her husband informed her that he tested positive for Covid. Pt stated she did an at home test today and she is positive for Covid. Associated symptoms include itchy throat, dry cough, headache, and diarrhea. No fever or shortness of breath.   Lab Results  Component Value Date   CREATININE 0.84 12/04/2019   BUN 15 12/04/2019   NA 140 12/04/2019   K 4.6 12/04/2019   CL 104 12/04/2019   CO2 19 (L) 12/04/2019   Lab Results  Component Value Date   CHOL 215 (H) 12/04/2019   HDL 55 12/04/2019   LDLCALC 114 (H) 12/04/2019   TRIG 266 (H) 12/04/2019   CHOLHDL 3.9 12/04/2019   Lab Results  Component Value Date   TSH 2.020 12/04/2019   Lab Results  Component Value Date   HGBA1C 5.7 (H) 12/04/2019   Lab Results  Component Value Date   WBC 6.3 12/04/2019   HGB 16.1 (H) 12/04/2019   HCT 47.5 (H) 12/04/2019   MCV 91 12/04/2019   PLT 231 12/04/2019   Lab Results  Component Value Date   ALT 23 12/04/2019   AST 22 12/04/2019   ALKPHOS 78 12/04/2019   BILITOT 0.4 12/04/2019     Review of Systems  Constitutional:  Negative for chills and fever.  HENT:  Negative for sinus pressure.   Respiratory:  Positive for cough. Negative for shortness of breath and wheezing.   Cardiovascular:  Negative for chest pain and  palpitations.  Gastrointestinal:  Positive for diarrhea and nausea.  Neurological:  Positive for dizziness and headaches.   Patient Active Problem List   Diagnosis Date Noted   Rotator cuff impingement syndrome of right shoulder 10/27/2020   Rotator cuff impingement syndrome, left 10/27/2020   Cervical paraspinal muscle spasm 10/27/2020   Hyperlipidemia, mild 12/26/2018   BMI 33.0-33.9,adult 12/26/2018   Benign neoplasm of cecum    Benign neoplasm of transverse colon     Allergies  Allergen Reactions   Percocet [Oxycodone-Acetaminophen] Nausea Only    Dizziness   Sulfa Antibiotics Rash    Past Surgical History:  Procedure Laterality Date   ABDOMINAL HYSTERECTOMY     still have cervix and one ovary   BREAST CYST EXCISION Right    neg   COLONOSCOPY WITH PROPOFOL N/A 01/05/2018   Procedure: COLONOSCOPY WITH PROPOFOL;  Surgeon: Lucilla Lame, MD;  Location: [facility];  Service: Endoscopy;  Laterality: N/A;   POLYPECTOMY  01/05/2018   Procedure: POLYPECTOMY INTESTINAL;  Surgeon: Lucilla Lame, MD;  Location: [facility];  Service: Endoscopy;;   PROLAPSED UTERINE FIBROID LIGATION      Social History   Tobacco Use   Smoking status: Never   Smokeless tobacco: Never  Vaping Use   Vaping Use: Never used  Substance Use Topics   Alcohol use: Not Currently    Alcohol/week: 3.0 standard drinks    Types: 3 Glasses of wine per week   Drug use: Never     Medication list has been reviewed and updated.  Current Meds  Medication Sig   baclofen (LIORESAL) 10 MG tablet Take 1 tablet (10 mg total) by mouth at bedtime as needed for muscle spasms.   DULoxetine (CYMBALTA) 30 MG capsule Take 1 capsule (30 mg total) by mouth every evening for 14 days, THEN 2 capsules (60 mg total) every evening.   meloxicam (MOBIC) 15 MG tablet Take 1 tablet (15 mg total) by mouth daily as needed for pain.   POTASSIUM PO Take 1 tablet by mouth daily.   simvastatin (ZOCOR) 40 MG tablet  Take 1 tablet (40 mg total) by mouth daily.    PHQ 2/9 Scores 11/10/2020 10/30/2020 10/27/2020 01/02/2020  PHQ - 2 Score 0 0 0 0  PHQ- 9 Score 0 0 0 0    GAD 7 : Generalized Anxiety Score 11/10/2020 10/30/2020 10/27/2020 01/02/2020  Nervous, Anxious, on Edge 0 0 0 0  Control/stop worrying 0 0 0 0  Worry too much - different things 0 0 0 0  Trouble relaxing 0 0 0 0  Restless 0 0 0 0  Easily annoyed or irritable 0 0 0 0  Afraid - awful might happen 0 0 0 0  Total GAD 7 Score 0 0 0 0  Anxiety Difficulty Not difficult at all Not difficult at all Not difficult at all -    BP Readings from Last 3 Encounters:  11/10/20 (!) 124/92  10/30/20 (!) 144/90  10/27/20 (!) 138/94    Physical Exam Constitutional:      Appearance: Normal appearance.  Pulmonary:     Effort: Pulmonary effort is normal.     Comments: No cough or dyspnea noted during the call Neurological:     General: No focal deficit present.     Mental Status: She is alert.  Psychiatric:        Attention and Perception: Attention normal.        Mood and Affect: Mood normal.        Speech: Speech normal.        Behavior: Behavior normal.        Cognition and Memory: Cognition normal.    Wt Readings from Last 3 Encounters:  11/11/20 183 lb (83 kg)  11/10/20 183 lb (83 kg)  10/30/20 185 lb (83.9 kg)    Temp 98.7 F (37.1 C) (Oral)   Ht 5\' 1"  (1.549 m)   Wt 183 lb (83 kg)   BMI 34.58 kg/m   Assessment and Plan: 1. Coronavirus infection Continue tylenol for fever and headache Push fluids and rest. Quarantine until Monday Nov 7. - molnupiravir EUA (LAGEVRIO) 200 mg CAPS capsule; Take 4 capsules (800 mg total) by mouth 2 (two) times daily for 5 days.  Dispense: 40 capsule; Refill: 0  I spent 7 minutes on the encounter. Partially dictated using Editor, commissioning. Any errors are unintentional.  Halina Maidens, MD [facility]  11/11/2020

## 2020-11-11 NOTE — Telephone Encounter (Signed)
Copied from [place] [phone]. Topic: General - Other >> Nov 11, 2020 11:01 AM Yvette Rack wrote: Reason for CRM: Pt reports that she had an appt yesterday (11/10/20) with Dr. Zigmund Daniel and when she returned home her husband informed her that he tested positive for Covid. Pt stated she did an at home test today and she is positive for Covid. Pt asked if there was a Rx that she could be prescribed to lessen her symptoms. Pt requests call back.

## 2020-11-18 ENCOUNTER — Other Ambulatory Visit: Payer: Self-pay

## 2020-11-18 ENCOUNTER — Encounter: Payer: Self-pay | Admitting: Physical Therapy

## 2020-11-18 ENCOUNTER — Ambulatory Visit: Payer: BC Managed Care – PPO | Attending: Family Medicine | Admitting: Physical Therapy

## 2020-11-18 DIAGNOSIS — R29898 Other symptoms and signs involving the musculoskeletal system: Secondary | ICD-10-CM | POA: Insufficient documentation

## 2020-11-18 DIAGNOSIS — M7541 Impingement syndrome of right shoulder: Secondary | ICD-10-CM | POA: Diagnosis present

## 2020-11-18 DIAGNOSIS — M25511 Pain in right shoulder: Secondary | ICD-10-CM | POA: Diagnosis present

## 2020-11-18 DIAGNOSIS — M25512 Pain in left shoulder: Secondary | ICD-10-CM | POA: Insufficient documentation

## 2020-11-18 DIAGNOSIS — M7542 Impingement syndrome of left shoulder: Secondary | ICD-10-CM | POA: Insufficient documentation
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
## 2020-11-18 NOTE — Patient Instructions (Signed)
Access Code: [email or web address] URL: [email or web address] Date: 11/18/2020 Prepared by: Dorcas Carrow  Exercises  Seated Shoulder Flexion AAROM with Pulley Behind - 1 x daily - 5 x weekly - 30 reps Seated Shoulder Abduction AAROM with Pulley Behind - 1 x daily - 5 x weekly - 30 reps Supine PNF D2 - 1 x daily - 5 x weekly - 20 reps Supine Single Arm Shoulder PNF D1 Flexion - 1 x daily - 5 x weekly - 20 reps

## 2020-11-24 ENCOUNTER — Encounter: Payer: Self-pay | Admitting: Physical Therapy

## 2020-11-24 ENCOUNTER — Ambulatory Visit: Payer: BC Managed Care – PPO | Admitting: Physical Therapy

## 2020-11-24 ENCOUNTER — Other Ambulatory Visit: Payer: Self-pay

## 2020-11-24 DIAGNOSIS — M25511 Pain in right shoulder: Secondary | ICD-10-CM

## 2020-11-24 DIAGNOSIS — M7541 Impingement syndrome of right shoulder: Secondary | ICD-10-CM | POA: Diagnosis not present

## 2020-11-24 DIAGNOSIS — R29898 Other symptoms and signs involving the musculoskeletal system: Secondary | ICD-10-CM

## 2020-11-24 DIAGNOSIS — M25512 Pain in left shoulder: Secondary | ICD-10-CM

## 2020-11-24 DIAGNOSIS — M7542 Impingement syndrome of left shoulder: Secondary | ICD-10-CM

## 2020-11-24 NOTE — Patient Instructions (Signed)
Access Code: [email or web address] URL: [email or web address] Date: 11/24/2020 Prepared by: Dorcas Carrow  Exercises  Seated Shoulder Flexion AAROM with Pulley Behind - 1 x daily - 5 x weekly - 3 sets - 10 reps Seated Shoulder Abduction AAROM with Pulley Behind - 1 x daily - 5 x weekly - 3 sets - 10 reps Supine Shoulder Abduction AROM - 1 x daily - 5 x weekly - 3 sets - 10 reps Seated Scapular Retraction - 1 x daily - 5 x weekly - 3 sets - 10 reps Supine Shoulder Flexion PROM to 90 Degrees - 1 x daily - 5 x weekly - 3 sets - 10 reps

## 2020-11-24 NOTE — Therapy (Signed)
[facility] [address]. [place], [place], [address] Phone: [phone]   Fax:  [phone]  Physical Therapy Treatment  Patient Details  Name: Elizabeth Frye MRN: [id number] Date of Birth: 10-31-1955 Referring Provider (PT): Rosette Reveal MD   Encounter Date: 11/24/2020   PT End of Session - 11/24/20 1548     Visit Number 2    Number of Visits 12    Date for PT Re-Evaluation 12/30/20    Authorization Type Pt has high copay progress to 1x week when able.    Authorization - Visit Number 2    Authorization - Number of Visits 10    PT Start Time 5701    PT Stop Time 1601    PT Time Calculation (min) 47 min    Activity Tolerance Patient tolerated treatment well;Patient limited by pain;No increased pain    Behavior During Therapy WFL for tasks assessed/performed             Past Medical History:  Diagnosis Date   DVT, lower extremity ([place]) 1997   after injury to leg   History of pulmonary embolus ([place]) 1997   Hyperlipidemia     Past Surgical History:  Procedure Laterality Date   ABDOMINAL HYSTERECTOMY     still have cervix and one ovary   BREAST CYST EXCISION Right    neg   COLONOSCOPY WITH PROPOFOL N/A 01/05/2018   Procedure: COLONOSCOPY WITH PROPOFOL;  Surgeon: Lucilla Lame, MD;  Location: [place];  Service: Endoscopy;  Laterality: N/A;   POLYPECTOMY  01/05/2018   Procedure: POLYPECTOMY INTESTINAL;  Surgeon: Lucilla Lame, MD;  Location: [place];  Service: Endoscopy;;   PROLAPSED UTERINE FIBROID LIGATION      There were no vitals filed for this visit.   Subjective Assessment - 11/24/20 1544     Subjective Pt presents to tx with 6/10 Bilat shoulder pain at rest and 8/10 pain with shoulder abd. Pt would like to pause tx 2/2 high co-pay and prolonged travel over the next few weeks. Pt is agreeable to an updated HEP and self IASTM to attempt self management of sx.    Pertinent History DVT  1997, Pt states high co-pay but is agreeable to POC. Pt enjoy Kayaking and being active.    Limitations Lifting;House hold activities    How long can you sit comfortably? WNL    How long can you stand comfortably? WNL    How long can you walk comfortably? WNL    Diagnostic tests x-ray 10/27/2020. R: Degenerative change without acute abnormality. L: Degenerative change without acute abnormality.    Patient Stated Goals Pain to go away and complete normal tasks without pain.    Currently in Pain? Yes    Pain Score 6     Pain Location Shoulder    Pain Orientation Right;Left    Pain Descriptors / Indicators Aching;Sharp    Pain Type Acute pain    Pain Onset More than a month ago              Treatment:  Therapeutic Exercise:  Pt was given an updated HEP and education on self IASTM to post bilat infraspinatus/ teres minor with tennis ball.   Access Code: [email or web address] URL: [email or web address] Date: 11/24/2020 Prepared by: Dorcas Carrow  Exercises  Seated Shoulder Flexion AAROM with Pulley Behind - 1 x daily - 5 x weekly - 3 sets - 10 reps Seated Shoulder Abduction AAROM with Pulley Behind -  1 x daily - 5 x weekly - 3 sets - 10 reps Supine Shoulder Abduction AROM - 1 x daily - 5 x weekly - 3 sets - 10 reps Seated Scapular Retraction - 1 x daily - 5 x weekly - 3 sets - 10 reps Supine Shoulder Flexion PROM to 90 Degrees - 1 x daily - 5 x weekly - 3 sets - 10 reps     Manual Therapy:   Seated and side lying STM to bilat infraspinatus/ teres minor with verbal cueing to ensure pressure with pt tolerance.       PT Education - 11/24/20 1546     Education Details Pt was educated on updated HEP, IASTM with tennis ball, and in the importance of HEP adherence for self management.    Person(s) Educated Patient    Methods Explanation;Handout;Tactile cues;Verbal cues    Comprehension Verbalized understanding                 PT Long Term Goals - 11/18/20 1505        PT LONG TERM GOAL #1   Title Pt will improve FOTO score to predicted value of 66 to measure self reported improvement in functional mobility    Baseline 47    Time 6    Period Weeks    Status New    Target Date 12/30/20      PT LONG TERM GOAL #2   Title Pt will be independent with HEP in order to decrease worse bilat shoulder pain to 7/10 or less in order to improve pain-free function at home and work.    Baseline worse pain 9/10    Time 6    Period Weeks    Status New    Target Date 12/30/20      PT LONG TERM GOAL #3   Title Pt will increase strength of by at least 1/2 MMT grade in order to demonstrate improvement in strength and function.    Baseline UE MMT: 4-/4- Shoulder flexion,  4-/4-Shoulder abduction, 4/4 Shoulder external rotation,    4/4 Shoulder internal rotation,  4/4 Elbow flexion, 4/4 Elbow extension.    Time 6    Period Weeks    Status New    Target Date 12/30/20      PT LONG TERM GOAL #4   Title PT will improve cervical and bilat shoulder AROM by atleast 10 degs in all direction to allow great access to overhead task    Baseline ROM: 120/109 Shoulder flexion, 96/90 Shoulder abduction, 87/85 Shoulder external rotation, 91/89 Shoulder internal rotation,  45 cervical flexion, 45 cervical extension, 34/32 cervical lateral flexion ,60/60 cervical rotation.    Time 6    Period Weeks    Status New    Target Date 12/30/20                 Plan - 11/24/20 1549     Clinical Impression Statement Today's tx was focused on rapid independence of self manage POC with update HEP and self IASTM with tennis ball. Pt displayed decrease in shoulder pain to 0/10 at  rest and 6/10 with movement. Pt's POC will be paused 2/2 to prolonged travel and high co-pay. Pt displayed excellent understanding of HEP and self management of POC. Pt was educated to reach out after travel for further assessment and updated HEP if needed.    Personal Factors and Comorbidities Time since  onset of injury/illness/exacerbation;Age;Fitness    Examination-Activity Limitations Bathing;Bed Mobility;Reach Overhead;Dressing;Hygiene/Grooming;Carry;Lift;Sleep  Examination-Participation Restrictions Cleaning;Interpersonal Relationship;Volunteer;Yard Work;Laundry;Shop    Stability/Clinical Decision Making Evolving/Moderate complexity    Clinical Decision Making Moderate    Rehab Potential Good    PT Frequency 2x / week    PT Duration 6 weeks    PT Treatment/Interventions Aquatic Therapy;Biofeedback;ADLs/Self Care Home Management;Electrical Stimulation;Moist Heat;Traction;Ultrasound;Gait training;Therapeutic activities;Functional mobility training;Therapeutic exercise;Balance training;Neuromuscular re-education;Stair training;Manual techniques;Passive range of motion;Dry needling;Energy conservation;Taping;Joint Manipulations;Spinal Manipulations    PT Next Visit Plan Pt paused 2/2 to prolonged travel and high co pay.    PT Home Exercise Plan [email or web address]    Consulted and Agree with Plan of Care Patient             Patient will benefit from skilled therapeutic intervention in order to improve the following deficits and impairments:  Decreased activity tolerance, Decreased coordination, Decreased endurance, Decreased mobility, Decreased safety awareness, Decreased range of motion, Decreased strength, Hypomobility, Increased muscle spasms, Impaired flexibility, Impaired perceived functional ability, Impaired UE functional use, Improper body mechanics, Pain, Postural dysfunction  Visit Diagnosis: Rotator cuff impingement syndrome of right shoulder  Rotator cuff impingement syndrome of left shoulder  Acute pain of both shoulders  Weakness of shoulder     Problem List Patient Active Problem List   Diagnosis Date Noted   Rotator cuff impingement syndrome of right shoulder 10/27/2020   Rotator cuff impingement syndrome, left 10/27/2020   Cervical paraspinal muscle spasm 10/27/2020    Hyperlipidemia, mild 12/26/2018   BMI 33.0-33.9,adult 12/26/2018   Benign neoplasm of cecum    Benign neoplasm of transverse colon    Pura Spice, PT, DPT # [id number] Fara Olden, SPT 11/25/2020, 8:57 AM  [facility] [address] [place], [place], [address] Phone: [phone]   Fax:  [phone]  Name: Elizabeth Frye MRN: [id number] Date of Birth: December 07, 1955

## 2020-11-26 ENCOUNTER — Ambulatory Visit: Payer: BC Managed Care – PPO | Admitting: Physical Therapy

## 2020-11-27 ENCOUNTER — Other Ambulatory Visit: Payer: Self-pay | Admitting: Internal Medicine

## 2020-11-27 NOTE — Telephone Encounter (Signed)
Requested Prescriptions  Pending Prescriptions Disp Refills  . simvastatin (ZOCOR) 40 MG tablet [Pharmacy Med Name: SIMVASTATIN 40 MG TABLET] 90 tablet 0    Sig: TAKE 1 TABLET BY MOUTH EVERY DAY     Cardiovascular:  Antilipid - Statins Failed - 11/27/2020  1:42 AM      Failed - Total Cholesterol in normal range and within 360 days    Cholesterol, Total  Date Value Ref Range Status  12/04/2019 215 (H) 100 - 199 mg/dL Final         Failed - LDL in normal range and within 360 days    LDL Chol Calc (NIH)  Date Value Ref Range Status  12/04/2019 114 (H) 0 - 99 mg/dL Final         Failed - Triglycerides in normal range and within 360 days    Triglycerides  Date Value Ref Range Status  12/04/2019 266 (H) 0 - 149 mg/dL Final         Passed - HDL in normal range and within 360 days    HDL  Date Value Ref Range Status  12/04/2019 55 >39 mg/dL Final         Passed - Patient is not pregnant      Passed - Valid encounter within last 12 months    Recent Outpatient Visits          2 weeks ago Coronavirus infection   [facility] Glean Hess, MD   2 weeks ago Rotator cuff impingement syndrome of right shoulder   [facility] Montel Culver, MD   4 weeks ago Rotator cuff impingement syndrome of right shoulder   [facility] Montel Culver, MD   1 month ago Rotator cuff impingement syndrome of right shoulder   [facility] Montel Culver, MD   11 months ago Acute non-recurrent sinusitis, unspecified location   [facility] Glean Hess, MD      Future Appointments            In 1 week Army Melia Jesse Sans, MD [facility], [place]   In 3 weeks Zigmund Daniel, Earley Abide, MD [facility], [facility]

## 2020-12-02 ENCOUNTER — Encounter: Payer: BC Managed Care – PPO | Admitting: Physical Therapy

## 2020-12-07 ENCOUNTER — Encounter: Payer: BC Managed Care – PPO | Admitting: Physical Therapy

## 2020-12-09 ENCOUNTER — Encounter: Payer: BC Managed Care – PPO | Admitting: Physical Therapy

## 2020-12-10 ENCOUNTER — Ambulatory Visit (INDEPENDENT_AMBULATORY_CARE_PROVIDER_SITE_OTHER): Payer: BC Managed Care – PPO | Admitting: Internal Medicine

## 2020-12-10 ENCOUNTER — Encounter: Payer: Self-pay | Admitting: Internal Medicine

## 2020-12-10 ENCOUNTER — Other Ambulatory Visit: Payer: Self-pay

## 2020-12-10 VITALS — BP 134/82 | HR 112 | Ht 61.0 in | Wt 178.0 lb

## 2020-12-10 DIAGNOSIS — R7303 Prediabetes: Secondary | ICD-10-CM | POA: Diagnosis not present

## 2020-12-10 DIAGNOSIS — Z0000 Encounter for general adult medical examination without abnormal findings: Secondary | ICD-10-CM

## 2020-12-10 DIAGNOSIS — Z23 Encounter for immunization: Secondary | ICD-10-CM

## 2020-12-10 DIAGNOSIS — Z1231 Encounter for screening mammogram for malignant neoplasm of breast: Secondary | ICD-10-CM | POA: Diagnosis not present

## 2020-12-10 DIAGNOSIS — E785 Hyperlipidemia, unspecified: Secondary | ICD-10-CM | POA: Diagnosis not present

## 2020-12-10 DIAGNOSIS — M7540 Impingement syndrome of unspecified shoulder: Secondary | ICD-10-CM

## 2020-12-10 MED ORDER — SIMVASTATIN 40 MG PO TABS: 40.0000 mg | ORAL_TABLET | Freq: Every day | ORAL | 3 refills | Status: DC

## 2020-12-10 NOTE — Progress Notes (Signed)
Date:  12/10/2020   Name:  Elizabeth Frye   DOB:  [birth year]   MRN:  [id number]   Chief Complaint: Annual Exam (Breast Exam.) Elizabeth Frye is a 65 y.o. female who presents today for her Complete Annual Exam. She feels well. She reports exercising for shoulders with sports medicine. She reports she is sleeping well. Breast complaints - none.  Mammogram: 01/2020 DEXA: none Pap smear: 12/2018 neg with co-testing Colonoscopy: 12/2017 repeat 5 yrs  Immunization History  Administered Date(s) Administered   Influenza Inj Mdck Quad Pf 11/02/2018   Influenza-Unspecified 11/01/2019   PFIZER(Purple Top)SARS-COV-2 Vaccination 04/04/2019, 04/29/2019, 11/01/2019   Tdap 11/18/2015   Zoster, Live 11/20/2014    Hyperlipidemia This is a chronic problem. The problem is controlled. Pertinent negatives include no chest pain or shortness of breath. Current antihyperlipidemic treatment includes statins. The current treatment provides significant improvement of lipids. There are no compliance problems.    Lab Results  Component Value Date   NA 140 12/04/2019   K 4.6 12/04/2019   CO2 19 (L) 12/04/2019   GLUCOSE 126 (H) 12/04/2019   BUN 15 12/04/2019   CREATININE 0.84 12/04/2019   CALCIUM 9.7 12/04/2019   GFRNONAA 75 12/04/2019   Lab Results  Component Value Date   CHOL 215 (H) 12/04/2019   HDL 55 12/04/2019   LDLCALC 114 (H) 12/04/2019   TRIG 266 (H) 12/04/2019   CHOLHDL 3.9 12/04/2019   Lab Results  Component Value Date   TSH 2.020 12/04/2019   Lab Results  Component Value Date   HGBA1C 5.7 (H) 12/04/2019   Lab Results  Component Value Date   WBC 6.3 12/04/2019   HGB 16.1 (H) 12/04/2019   HCT 47.5 (H) 12/04/2019   MCV 91 12/04/2019   PLT 231 12/04/2019   Lab Results  Component Value Date   ALT 23 12/04/2019   AST 22 12/04/2019   ALKPHOS 78 12/04/2019   BILITOT 0.4 12/04/2019   No results found for: 25OHVITD2, 25OHVITD3, VD25OH   Review of Systems   Constitutional:  Negative for chills, fatigue and fever.  HENT:  Negative for congestion, hearing loss, tinnitus, trouble swallowing and voice change.   Eyes:  Negative for visual disturbance.  Respiratory:  Negative for cough, chest tightness, shortness of breath and wheezing.   Cardiovascular:  Negative for chest pain, palpitations and leg swelling.  Gastrointestinal:  Negative for abdominal pain, constipation, diarrhea and vomiting.  Endocrine: Negative for polydipsia and polyuria.  Genitourinary:  Negative for dysuria, frequency, genital sores, vaginal bleeding and vaginal discharge.  Musculoskeletal:  Positive for arthralgias (both shoulders - impingement syndrome). Negative for gait problem and joint swelling.  Skin:  Negative for color change and rash.  Neurological:  Negative for dizziness, tremors, light-headedness and headaches.  Hematological:  Negative for adenopathy. Does not bruise/bleed easily.  Psychiatric/Behavioral:  Negative for dysphoric mood and sleep disturbance. The patient is not nervous/anxious.    Patient Active Problem List   Diagnosis Date Noted   Rotator cuff impingement syndrome of right shoulder 10/27/2020   Rotator cuff impingement syndrome, left 10/27/2020   Cervical paraspinal muscle spasm 10/27/2020   Hyperlipidemia, mild 12/26/2018   BMI 33.0-33.9,adult 12/26/2018   Benign neoplasm of cecum    Benign neoplasm of transverse colon     Allergies  Allergen Reactions   Percocet [Oxycodone-Acetaminophen] Nausea Only    Dizziness   Sulfa Antibiotics Rash    Past Surgical History:  Procedure Laterality Date   ABDOMINAL HYSTERECTOMY  still have cervix and one ovary   BREAST CYST EXCISION Right    neg   COLONOSCOPY WITH PROPOFOL N/A 01/05/2018   Procedure: COLONOSCOPY WITH PROPOFOL;  Surgeon: Lucilla Lame, MD;  Location: [place];  Service: Endoscopy;  Laterality: N/A;   POLYPECTOMY  01/05/2018   Procedure: POLYPECTOMY INTESTINAL;   Surgeon: Lucilla Lame, MD;  Location: [place];  Service: Endoscopy;;   PROLAPSED UTERINE FIBROID LIGATION      Social History   Tobacco Use   Smoking status: Never   Smokeless tobacco: Never  Vaping Use   Vaping Use: Never used  Substance Use Topics   Alcohol use: Not Currently    Alcohol/week: 3.0 standard drinks    Types: 3 Glasses of wine per week   Drug use: Never     Medication list has been reviewed and updated.  Current Meds  Medication Sig   baclofen (LIORESAL) 10 MG tablet Take 1 tablet (10 mg total) by mouth at bedtime as needed for muscle spasms.   DULoxetine (CYMBALTA) 30 MG capsule Take 1 capsule (30 mg total) by mouth every evening for 14 days, THEN 2 capsules (60 mg total) every evening.   meloxicam (MOBIC) 15 MG tablet Take 1 tablet (15 mg total) by mouth daily as needed for pain.   POTASSIUM PO Take 1 tablet by mouth daily.   simvastatin (ZOCOR) 40 MG tablet TAKE 1 TABLET BY MOUTH EVERY DAY    PHQ 2/9 Scores 11/10/2020 10/30/2020 10/27/2020 01/02/2020  PHQ - 2 Score 0 0 0 0  PHQ- 9 Score 0 0 0 0    GAD 7 : Generalized Anxiety Score 11/10/2020 10/30/2020 10/27/2020 01/02/2020  Nervous, Anxious, on Edge 0 0 0 0  Control/stop worrying 0 0 0 0  Worry too much - different things 0 0 0 0  Trouble relaxing 0 0 0 0  Restless 0 0 0 0  Easily annoyed or irritable 0 0 0 0  Afraid - awful might happen 0 0 0 0  Total GAD 7 Score 0 0 0 0  Anxiety Difficulty Not difficult at all Not difficult at all Not difficult at all -    BP Readings from Last 3 Encounters:  12/10/20 134/82  11/10/20 (!) 124/92  10/30/20 (!) 144/90    Physical Exam Vitals and nursing note reviewed.  Constitutional:      General: She is not in acute distress.    Appearance: She is well-developed.  HENT:     Head: Normocephalic and atraumatic.     Right Ear: There is impacted cerumen (canal completely occluded).     Left Ear: There is impacted cerumen (canal partially  occluded).     Nose:     Right Sinus: No maxillary sinus tenderness.     Left Sinus: No maxillary sinus tenderness.  Eyes:     General: No scleral icterus.       Right eye: No discharge.        Left eye: No discharge.     Conjunctiva/sclera: Conjunctivae normal.  Neck:     Thyroid: No thyromegaly.     Vascular: No carotid bruit.  Cardiovascular:     Rate and Rhythm: Normal rate and regular rhythm.     Pulses: Normal pulses.     Heart sounds: Normal heart sounds.  Pulmonary:     Effort: Pulmonary effort is normal. No respiratory distress.     Breath sounds: No wheezing.  Chest:  Breasts:    Right:  No mass, nipple discharge, skin change or tenderness.     Left: No mass, nipple discharge, skin change or tenderness.  Abdominal:     General: Bowel sounds are normal.     Palpations: Abdomen is soft.     Tenderness: There is no abdominal tenderness.  Musculoskeletal:     Right shoulder: Tenderness present. Decreased range of motion.     Left shoulder: Tenderness present. Decreased range of motion.     Cervical back: Normal range of motion. No erythema.     Right lower leg: No edema.     Left lower leg: No edema.  Lymphadenopathy:     Cervical: No cervical adenopathy.  Skin:    General: Skin is warm and dry.     Findings: No rash.  Neurological:     Mental Status: She is alert and oriented to person, place, and time.     Cranial Nerves: No cranial nerve deficit.     Sensory: No sensory deficit.     Deep Tendon Reflexes: Reflexes are normal and symmetric.  Psychiatric:        Attention and Perception: Attention normal.        Mood and Affect: Mood normal.    Wt Readings from Last 3 Encounters:  12/10/20 178 lb (80.7 kg)  11/11/20 183 lb (83 kg)  11/10/20 183 lb (83 kg)    BP 134/82   Pulse (!) 112   Ht 5\' 1"  (1.549 m)   Wt 178 lb (80.7 kg)   SpO2 98%   BMI 33.63 kg/m   Assessment and Plan: 1. Annual physical exam Exam is normal except for weight. Encourage  regular exercise and appropriate dietary changes. Up to date on screenings and immunizations. Shingrix eligible. - CBC with Differential/Platelet  2. Encounter for screening mammogram for breast cancer Schedule in January - MM 3D SCREEN BREAST BILATERAL  3. Hyperlipidemia, mild Tolerating statin medication without side effects at this time Continue same therapy without change at this time. - Lipid panel - simvastatin (ZOCOR) 40 MG tablet; Take 1 tablet (40 mg total) by mouth daily.  Dispense: 90 tablet; Refill: 3  4. Prediabetes Continue healthy diet; screening labs - Comprehensive metabolic panel - Hemoglobin A1c  5. Need for immunization against influenza - Flu Vaccine QUAD 23mo+IM (Fluarix, Fluzone & Alfiuria Quad PF)  6. Rotator cuff impingement syndrome, unspecified laterality Continue PTx and exercises Mobic and Cymbalta Follow up with SM   Partially dictated using Editor, commissioning. Any errors are unintentional.  Halina Maidens, MD [facility]  12/10/2020

## 2020-12-11 LAB — CBC WITH DIFFERENTIAL/PLATELET
Basophils Absolute: 0 10*3/uL (ref 0.0–0.2)
Basos: 1 %
EOS (ABSOLUTE): 0.1 10*3/uL (ref 0.0–0.4)
Eos: 2 %
Hematocrit: 46.2 % (ref 34.0–46.6)
Hemoglobin: 15.2 g/dL (ref 11.1–15.9)
Immature Grans (Abs): 0 10*3/uL (ref 0.0–0.1)
Immature Granulocytes: 1 %
Lymphocytes Absolute: 2.1 10*3/uL (ref 0.7–3.1)
Lymphs: 26 %
MCH: 29.2 pg (ref 26.6–33.0)
MCHC: 32.9 g/dL (ref 31.5–35.7)
MCV: 89 fL (ref 79–97)
Monocytes Absolute: 0.7 10*3/uL (ref 0.1–0.9)
Monocytes: 9 %
Neutrophils Absolute: 5 10*3/uL (ref 1.4–7.0)
Neutrophils: 61 %
Platelets: 274 10*3/uL (ref 150–450)
RBC: 5.2 x10E6/uL (ref 3.77–5.28)
RDW: 12.9 % (ref 11.7–15.4)
WBC: 8 10*3/uL (ref 3.4–10.8)

## 2020-12-11 LAB — LIPID PANEL
Chol/HDL Ratio: 3.5 ratio (ref 0.0–4.4)
Cholesterol, Total: 184 mg/dL (ref 100–199)
HDL: 52 mg/dL (ref >39)
LDL Chol Calc (NIH): 92 mg/dL (ref 0–99)
Triglycerides: 238 mg/dL — ABNORMAL HIGH (ref 0–149)
VLDL Cholesterol Cal: 40 mg/dL (ref 5–40)

## 2020-12-11 LAB — COMPREHENSIVE METABOLIC PANEL
ALT: 17 IU/L (ref 0–32)
AST: 16 IU/L (ref 0–40)
Albumin/Globulin Ratio: 1.8 (ref 1.2–2.2)
Albumin: 4.6 g/dL (ref 3.8–4.8)
Alkaline Phosphatase: 100 IU/L (ref 44–121)
BUN/Creatinine Ratio: 24 (ref 12–28)
BUN: 16 mg/dL (ref 8–27)
Bilirubin Total: 0.5 mg/dL (ref 0.0–1.2)
CO2: 22 mmol/L (ref 20–29)
Calcium: 9.9 mg/dL (ref 8.7–10.3)
Chloride: 108 mmol/L — ABNORMAL HIGH (ref 96–106)
Creatinine, Ser: 0.68 mg/dL (ref 0.57–1.00)
Globulin, Total: 2.5 g/dL (ref 1.5–4.5)
Glucose: 118 mg/dL — ABNORMAL HIGH (ref 70–99)
Potassium: 4.8 mmol/L (ref 3.5–5.2)
Sodium: 144 mmol/L (ref 134–144)
Total Protein: 7.1 g/dL (ref 6.0–8.5)
eGFR: 98 mL/min/{1.73_m2} (ref >59)

## 2020-12-11 LAB — HEMOGLOBIN A1C
Est. average glucose Bld gHb Est-mCnc: 126 mg/dL
Hgb A1c MFr Bld: 6 % — ABNORMAL HIGH (ref 4.8–5.6)

## 2020-12-14 ENCOUNTER — Encounter: Payer: BC Managed Care – PPO | Admitting: Physical Therapy

## 2020-12-16 ENCOUNTER — Encounter: Payer: BC Managed Care – PPO | Admitting: Physical Therapy

## 2020-12-21 ENCOUNTER — Encounter: Payer: BC Managed Care – PPO | Admitting: Physical Therapy

## 2020-12-22 ENCOUNTER — Ambulatory Visit: Payer: BC Managed Care – PPO | Admitting: Family Medicine

## 2020-12-22 ENCOUNTER — Other Ambulatory Visit: Payer: Self-pay

## 2020-12-22 ENCOUNTER — Encounter: Payer: Self-pay | Admitting: Family Medicine

## 2020-12-22 ENCOUNTER — Ambulatory Visit: Payer: BC Managed Care – PPO | Admitting: Internal Medicine

## 2020-12-22 VITALS — BP 126/78 | HR 110 | Ht 61.0 in | Wt 180.0 lb

## 2020-12-22 DIAGNOSIS — M62838 Other muscle spasm: Secondary | ICD-10-CM | POA: Diagnosis not present

## 2020-12-22 DIAGNOSIS — M7918 Myalgia, other site: Secondary | ICD-10-CM

## 2020-12-22 DIAGNOSIS — M7542 Impingement syndrome of left shoulder: Secondary | ICD-10-CM | POA: Diagnosis not present

## 2020-12-22 DIAGNOSIS — M7541 Impingement syndrome of right shoulder: Secondary | ICD-10-CM

## 2020-12-22 DIAGNOSIS — G8929 Other chronic pain: Secondary | ICD-10-CM | POA: Insufficient documentation

## 2020-12-22 MED ORDER — DULOXETINE HCL 60 MG PO CPEP
60.0000 mg | ORAL_CAPSULE | Freq: Every day | ORAL | 0 refills | Status: DC
Start: 2020-12-22 — End: 2021-01-14

## 2020-12-22 MED ORDER — DICLOFENAC SODIUM 50 MG PO TBEC: 50.0000 mg | DELAYED_RELEASE_TABLET | Freq: Two times a day (BID) | ORAL | 1 refills | Status: DC

## 2020-12-22 NOTE — Assessment & Plan Note (Signed)
See additional assessment(s) for plan details. 

## 2020-12-22 NOTE — Progress Notes (Signed)
Primary Care / Sports Medicine Office Visit  Patient Information:  Patient ID: Elizabeth Frye, female DOB: 05/14/1955 Age: 65 y.o. MRN: [id number]   Elizabeth Frye is a pleasant 65 y.o. female presenting with the following:  Chief Complaint  Patient presents with   Rotator cuff impingement syndrome    Bilateral; worse since last visit per patient; taking duloxetine 60 mg daily with no side effects, but notices no difference in alleviating pain; reports generalized pain in office today; doing home exercises, followed twice with PT, but stopped due to cost; 8/10 pain     Patient Active Problem List   Diagnosis Date Noted   Chronic musculoskeletal pain 12/22/2020   Rotator cuff impingement syndrome of right shoulder 10/27/2020   Rotator cuff impingement syndrome, left 10/27/2020   Cervical paraspinal muscle spasm 10/27/2020   Hyperlipidemia, mild 12/26/2018   BMI 33.0-33.9,adult 12/26/2018   Benign neoplasm of cecum    Benign neoplasm of transverse colon     Vitals:   12/22/20 0957  BP: 126/78  Pulse: (!) 110  SpO2: 98%   Vitals:   12/22/20 0957  Weight: 180 lb (81.6 kg)  Height: 5\' 1"  (1.549 m)   Body mass index is 34.01 kg/m.  No results found.   Independent interpretation of notes and tests performed by another provider:   None  Procedures performed:   None  Pertinent History, Exam, Impression, and Recommendations:   Rotator cuff impingement syndrome of right shoulder See additional assessment(s) for plan details.   Rotator cuff impingement syndrome, left See additional assessment(s) for plan details.  Cervical paraspinal muscle spasm See additional assessment(s) for plan details.  Chronic musculoskeletal pain Patient presents for follow-up to bilateral rotator cuff related impingement of the shoulders, had not been noting benefit from meloxicam, however after recent discontinuation, she did note worsening symptomatology and now feels that  it was effective.  She has been compliant with home exercises on a regular basis, has not noted improvement with muscle relaxers and, in fact, had noted urinary issues.  Over the interim she has had progression of her neck pain, newly endorsed lower extremity pain, primarily in the medial thighs.  She denies any overt weakness, difficulty arising out of a chair, no paresthesias in the upper or lower extremities.  Physical examination is consistent with persistent focality to the supraspinatus primarily where there is maximal discomfort during empty can testing, 5/5 strength noted, external rotation and internal rotation elicit pain with full strength.  She has positive impingement testing bilaterally with symptoms on the right worse on the left.  There is paraspinal muscular tenderness and negative Spurling's testing bilaterally, sensorimotor otherwise intact in the extremities.  Given her constellation of findings I did review various evaluation and management steps.  Given her whole concern for autoimmune rheumatologic process, additionally can be consistent with fibromyalgia, and/or focal symptomatology at bilateral shoulders, cervical spine, hips/trunk.  She is not amenable to surgical evaluation or advanced imaging, will consider possible interventions through pain and spine in the future, and is amenable to serum testing for autoimmune markers.  We will transition meloxicam to diclofenac twice daily, continue home exercises, continue duloxetine 60 mg daily.  Our office will reach out to the patient with lab results and she is to provide a status update in 1 month's time.  Medications are working well in 1 month, plan to refill and continue, otherwise plan for transition to alternate NSAID, revisit advanced imaging, and can consider short-term adjunct  pain control options given her upcoming travel.   Orders & Medications Meds ordered this encounter  Medications   diclofenac (VOLTAREN) 50 MG EC  tablet    Sig: Take 1 tablet (50 mg total) by mouth 2 (two) times daily.    Dispense:  60 tablet    Refill:  1   DULoxetine (CYMBALTA) 60 MG capsule    Sig: Take 1 capsule (60 mg total) by mouth daily.    Dispense:  90 capsule    Refill:  0   Orders Placed This Encounter  Procedures   ANA 12Plus Profile, Do All RDL     Return if symptoms worsen or fail to improve.     Montel Culver, MD   Primary Care Sports Medicine [facility]

## 2020-12-22 NOTE — Patient Instructions (Signed)
-  Obtain blood work -Dose diclofenac two times daily -Continue duloxetine 60 mg, a new prescription was sent to the pharmacy for 60 mg capsule -Continue with home exercises -Contact our office in 4 weeks

## 2020-12-22 NOTE — Assessment & Plan Note (Signed)
Patient presents for follow-up to bilateral rotator cuff related impingement of the shoulders, had not been noting benefit from meloxicam, however after recent discontinuation, she did note worsening symptomatology and now feels that it was effective.  She has been compliant with home exercises on a regular basis, has not noted improvement with muscle relaxers and, in fact, had noted urinary issues.  Over the interim she has had progression of her neck pain, newly endorsed lower extremity pain, primarily in the medial thighs.  She denies any overt weakness, difficulty arising out of a chair, no paresthesias in the upper or lower extremities.  Physical examination is consistent with persistent focality to the supraspinatus primarily where there is maximal discomfort during empty can testing, 5/5 strength noted, external rotation and internal rotation elicit pain with full strength.  She has positive impingement testing bilaterally with symptoms on the right worse on the left.  There is paraspinal muscular tenderness and negative Spurling's testing bilaterally, sensorimotor otherwise intact in the extremities.  Given her constellation of findings I did review various evaluation and management steps.  Given her whole concern for autoimmune rheumatologic process, additionally can be consistent with fibromyalgia, and/or focal symptomatology at bilateral shoulders, cervical spine, hips/trunk.  She is not amenable to surgical evaluation or advanced imaging, will consider possible interventions through pain and spine in the future, and is amenable to serum testing for autoimmune markers.  We will transition meloxicam to diclofenac twice daily, continue home exercises, continue duloxetine 60 mg daily.  Our office will reach out to the patient with lab results and she is to provide a status update in 1 month's time.  Medications are working well in 1 month, plan to refill and continue, otherwise plan for transition to  alternate NSAID, revisit advanced imaging, and can consider short-term adjunct pain control options given her upcoming travel.

## 2020-12-22 NOTE — Assessment & Plan Note (Addendum)
See additional assessment(s) for plan details. 

## 2020-12-24 ENCOUNTER — Encounter: Payer: BC Managed Care – PPO | Admitting: Physical Therapy

## 2020-12-28 ENCOUNTER — Encounter: Payer: BC Managed Care – PPO | Admitting: Physical Therapy

## 2020-12-30 ENCOUNTER — Encounter: Payer: BC Managed Care – PPO | Admitting: Physical Therapy

## 2021-01-02 LAB — ANA TITER AND PATTERN
Nucleolar Pattern: 1:40 {titer} — ABNORMAL HIGH
Speckled Pattern: 1:40 {titer} — ABNORMAL HIGH

## 2021-01-02 LAB — ANA 12PLUS PROFILE, DO ALL RDL
Anti-CCP Ab, IgG & IgA (RDL): <20 Units (ref <20)
Anti-Cardiolipin Ab, IgA (RDL): <12 APL U/mL (ref <12)
Anti-Cardiolipin Ab, IgG (RDL): <15 GPL U/mL (ref <15)
Anti-Cardiolipin Ab, IgM (RDL): <13 MPL U/mL (ref <13)
Anti-Centromere Ab (RDL): <1:40 {titer}
Anti-Chromatin Ab, IgG (RDL): <20 Units (ref <20)
Anti-La (SS-B) Ab (RDL): <20 Units (ref <20)
Anti-Nuclear Ab by IFA (RDL): POSITIVE — AB
Anti-Ro (SS-A) Ab (RDL): <20 Units (ref <20)
Anti-Scl-70 Ab (RDL): <20 Units (ref <20)
Anti-Sm Ab (RDL): <20 Units (ref <20)
Anti-TPO Ab (RDL): <9 IU/mL (ref <9.0)
Anti-U1 RNP Ab (RDL): <20 Units (ref <20)
Anti-dsDNA Ab by Farr(RDL): <8 IU/mL (ref <8.0)
C3 Complement (RDL): 198 mg/dL — ABNORMAL HIGH (ref 82–167)
C4 Complement (RDL): 43 mg/dL (ref 14–44)
Rheumatoid Factor by Turb RDL: <14 IU/mL (ref <14)

## 2021-01-05 ENCOUNTER — Telehealth: Payer: Self-pay

## 2021-01-05 DIAGNOSIS — M7918 Myalgia, other site: Secondary | ICD-10-CM

## 2021-01-05 DIAGNOSIS — R768 Other specified abnormal immunological findings in serum: Secondary | ICD-10-CM

## 2021-01-05 NOTE — Telephone Encounter (Signed)
-----   Message from Montel Culver, MD sent at 01/05/2021  8:50 AM EST ----- Please coordinate referral to rheumatology for positive ANA (dx 1) in the setting of chronic musculoskeletal pain (dx 2).

## 2021-01-05 NOTE — Telephone Encounter (Signed)
Rheumatology referral placed.  Patient viewed results via [place].

## 2021-01-06 ENCOUNTER — Encounter: Payer: BC Managed Care – PPO | Admitting: Physical Therapy

## 2021-01-14 ENCOUNTER — Other Ambulatory Visit: Payer: Self-pay

## 2021-01-14 ENCOUNTER — Ambulatory Visit
Admission: RE | Admit: 2021-01-14 | Discharge: 2021-01-14 | Disposition: A | Discharge status: Home / Self Care | Payer: BC Managed Care – PPO | Source: Ambulatory Visit | Attending: Internal Medicine | Admitting: Internal Medicine

## 2021-01-14 DIAGNOSIS — G8929 Other chronic pain: Secondary | ICD-10-CM

## 2021-01-14 DIAGNOSIS — M7918 Myalgia, other site: Secondary | ICD-10-CM

## 2021-01-14 DIAGNOSIS — M7541 Impingement syndrome of right shoulder: Secondary | ICD-10-CM

## 2021-01-14 DIAGNOSIS — Z1231 Encounter for screening mammogram for malignant neoplasm of breast: Secondary | ICD-10-CM | POA: Diagnosis not present

## 2021-01-14 DIAGNOSIS — M62838 Other muscle spasm: Secondary | ICD-10-CM

## 2021-01-14 DIAGNOSIS — M7542 Impingement syndrome of left shoulder: Secondary | ICD-10-CM

## 2021-01-14 MED ORDER — DULOXETINE HCL 60 MG PO CPEP: 60.0000 mg | ORAL_CAPSULE | Freq: Every day | ORAL | 0 refills | Status: DC

## 2021-02-10 ENCOUNTER — Other Ambulatory Visit: Payer: Self-pay

## 2021-02-10 DIAGNOSIS — R768 Other specified abnormal immunological findings in serum: Secondary | ICD-10-CM

## 2021-02-10 DIAGNOSIS — G8929 Other chronic pain: Secondary | ICD-10-CM

## 2021-02-10 DIAGNOSIS — M7918 Myalgia, other site: Secondary | ICD-10-CM

## 2021-02-11 ENCOUNTER — Other Ambulatory Visit: Payer: Self-pay | Admitting: Family Medicine

## 2021-02-11 DIAGNOSIS — M62838 Other muscle spasm: Secondary | ICD-10-CM

## 2021-02-11 DIAGNOSIS — M7542 Impingement syndrome of left shoulder: Secondary | ICD-10-CM

## 2021-02-11 DIAGNOSIS — M7541 Impingement syndrome of right shoulder: Secondary | ICD-10-CM

## 2021-02-11 DIAGNOSIS — G8929 Other chronic pain: Secondary | ICD-10-CM

## 2021-02-11 NOTE — Telephone Encounter (Signed)
Requested Prescriptions  Pending Prescriptions Disp Refills   diclofenac (VOLTAREN) 50 MG EC tablet [Pharmacy Med Name: DICLOFENAC SOD EC 50 MG TAB] 60 tablet 1    Sig: TAKE 1 TABLET BY MOUTH TWICE A DAY     Analgesics:  NSAIDS Failed - 02/11/2021  2:21 AM      Failed - Manual Review: Labs are only required if the patient has taken medication for more than 8 weeks.      Passed - Cr in normal range and within 360 days    Creatinine, Ser  Date Value Ref Range Status  12/10/2020 0.68 0.57 - 1.00 mg/dL Final         Passed - HGB in normal range and within 360 days    Hemoglobin  Date Value Ref Range Status  12/10/2020 15.2 11.1 - 15.9 g/dL Final         Passed - PLT in normal range and within 360 days    Platelets  Date Value Ref Range Status  12/10/2020 274 150 - 450 x10E3/uL Final         Passed - HCT in normal range and within 360 days    Hematocrit  Date Value Ref Range Status  12/10/2020 46.2 34.0 - 46.6 % Final         Passed - eGFR is 30 or above and within 360 days    GFR calc Af Amer  Date Value Ref Range Status  12/04/2019 86 >59 mL/min/1.73 Final    Comment:    **In accordance with recommendations from the NKF-ASN Task force,**   [facility] is in the process of updating its eGFR calculation to the   2021 CKD-EPI creatinine equation that estimates kidney function   without a race variable.    GFR calc non Af Amer  Date Value Ref Range Status  12/04/2019 75 >59 mL/min/1.73 Final   eGFR  Date Value Ref Range Status  12/10/2020 98 >59 mL/min/1.73 Final         Passed - Patient is not pregnant      Passed - Valid encounter within last 12 months    Recent Outpatient Visits          1 month ago Chronic musculoskeletal pain   [facility] Montel Culver, MD   2 months ago Annual physical exam   [facility] Glean Hess, MD   3 months ago Coronavirus infection   [facility] Glean Hess, MD   3 months ago Rotator cuff  impingement syndrome of right shoulder   [facility] Montel Culver, MD   3 months ago Rotator cuff impingement syndrome of right shoulder   [facility] Montel Culver, MD      Future Appointments            In 10 months Army Melia Jesse Sans, MD [facility] [place], [facility]

## 2021-02-28 IMAGING — MG DIGITAL SCREENING BILAT W/ TOMO W/ CAD
8 series · 8 of 24 positions shown · non-contrast
Comparison: Previous exam(s).

CLINICAL DATA: Screening.

EXAM:
DIGITAL SCREENING BILATERAL MAMMOGRAM WITH TOMO AND CAD

[R MLO synth-2D]
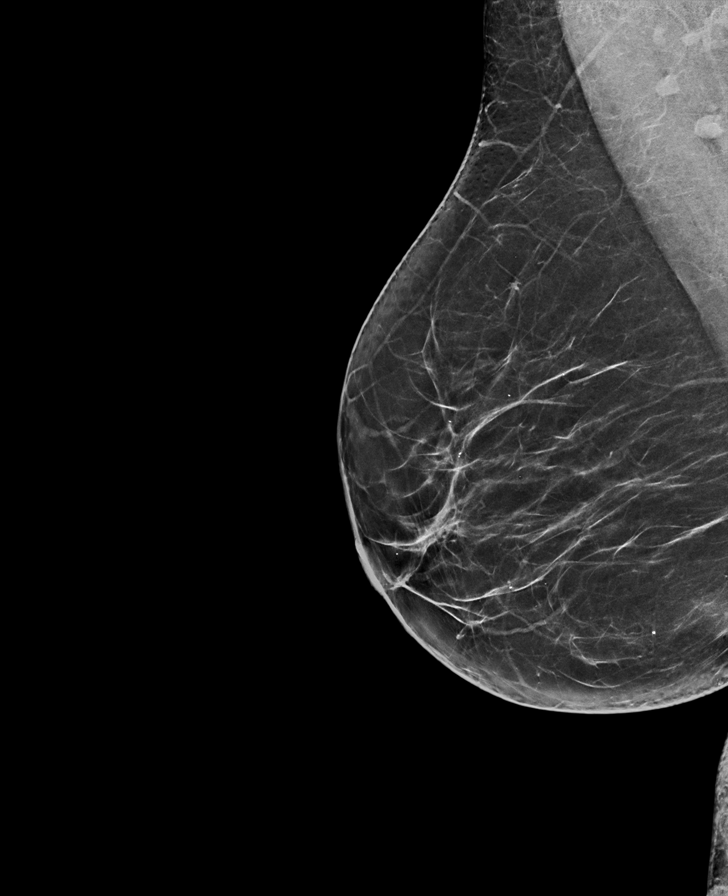

[R CC synth-2D]
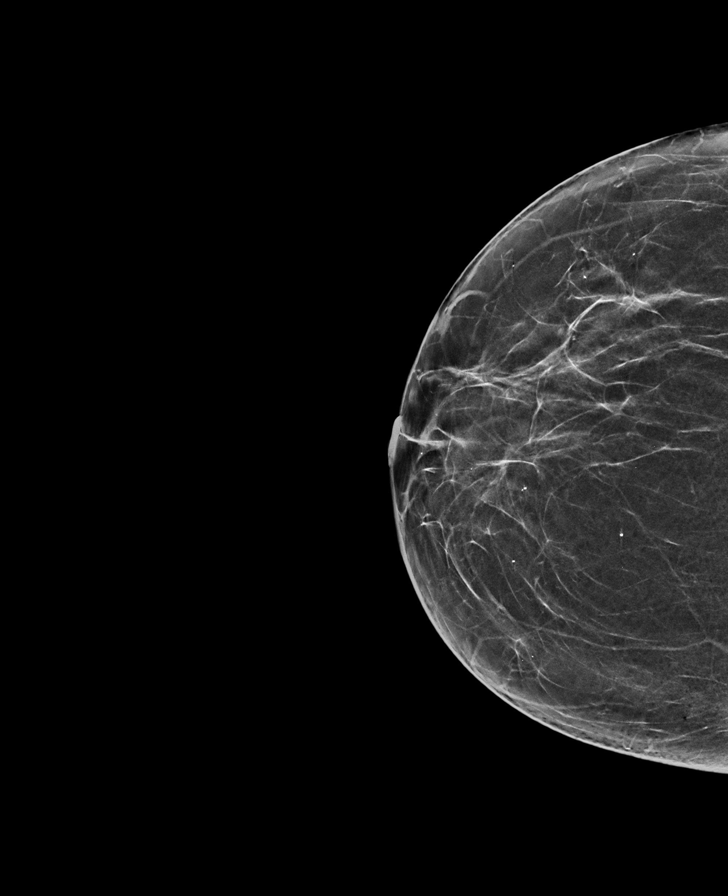

[L CC synth-2D]
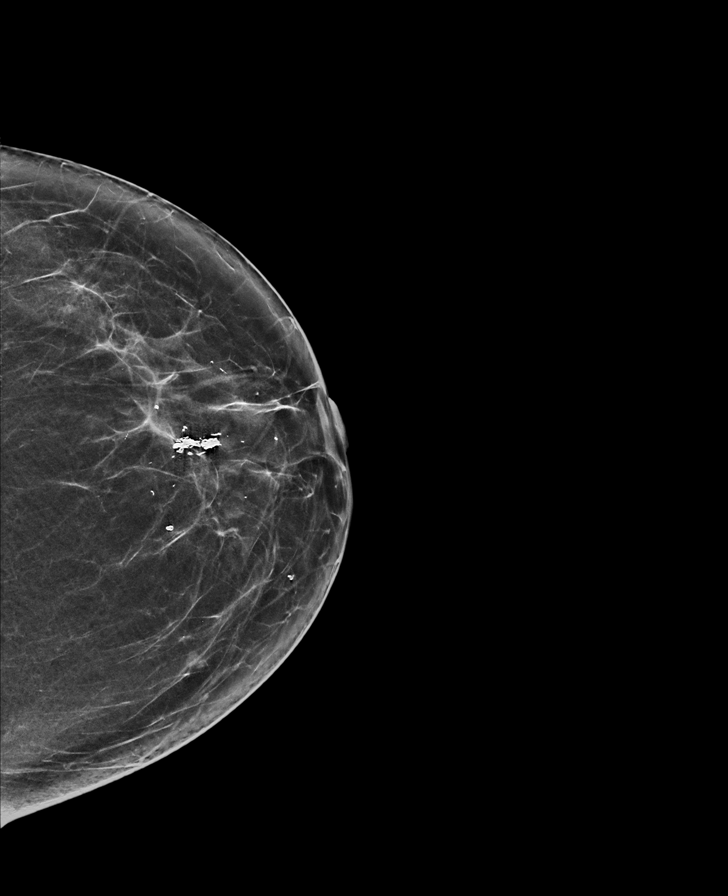

[L MLO synth-2D]
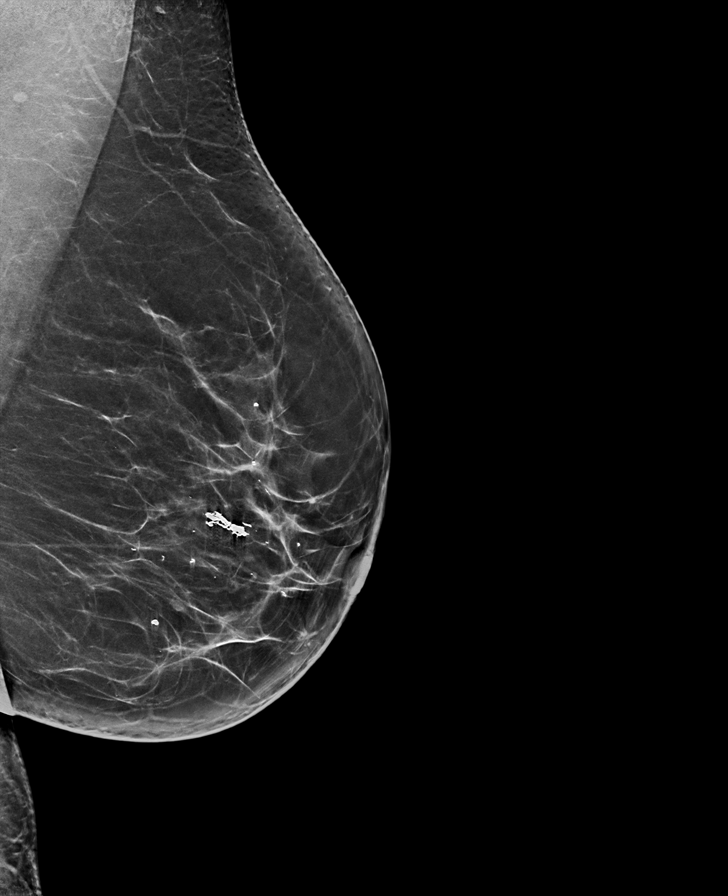

[R CC tomo · tomo slice 31/61.0]
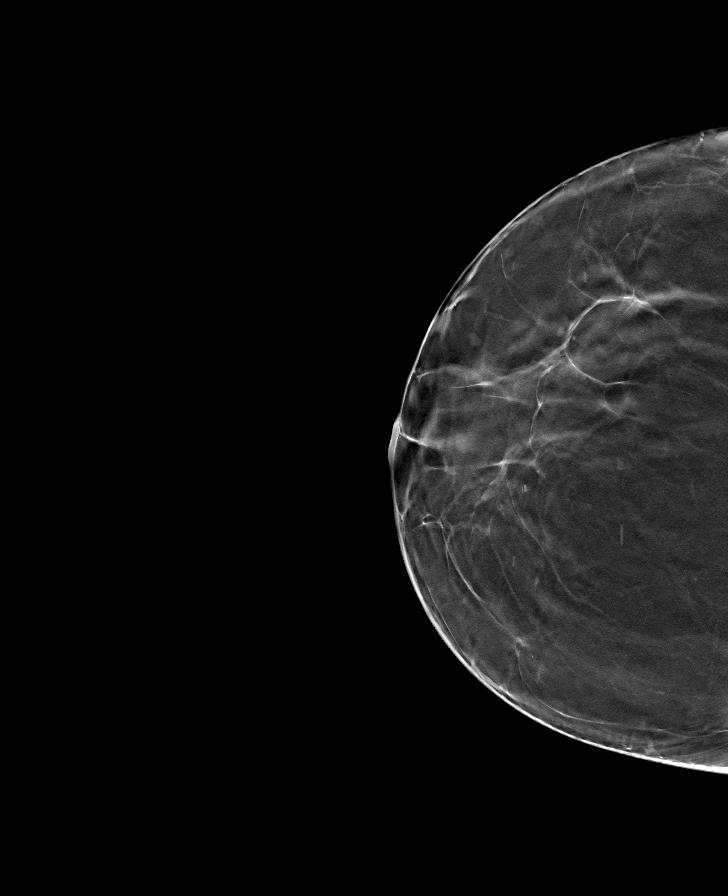

[L CC tomo · tomo slice 35/68.0]
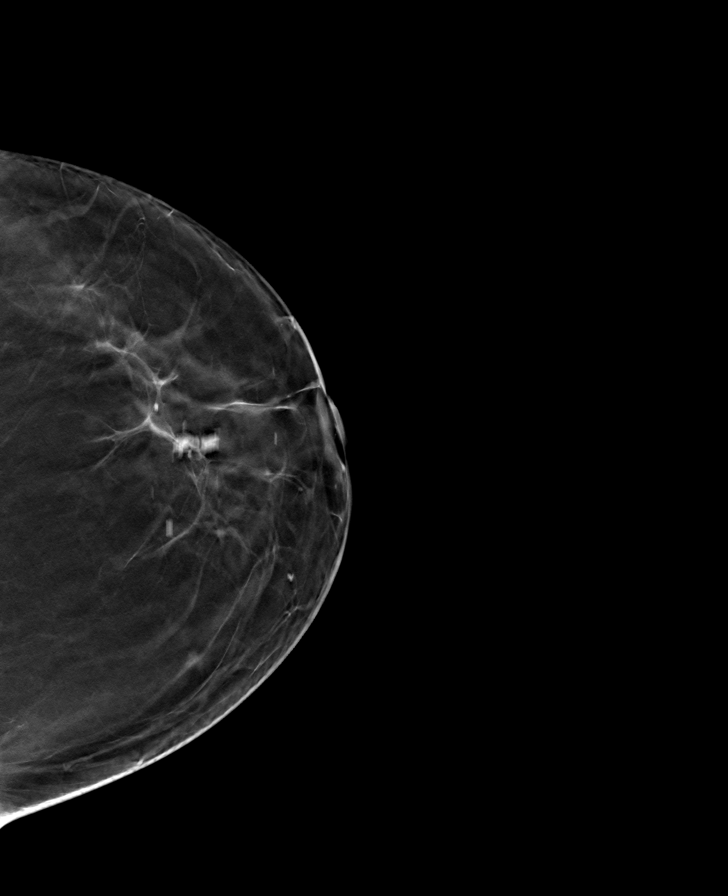

[L MLO tomo · tomo slice 37/74.0]
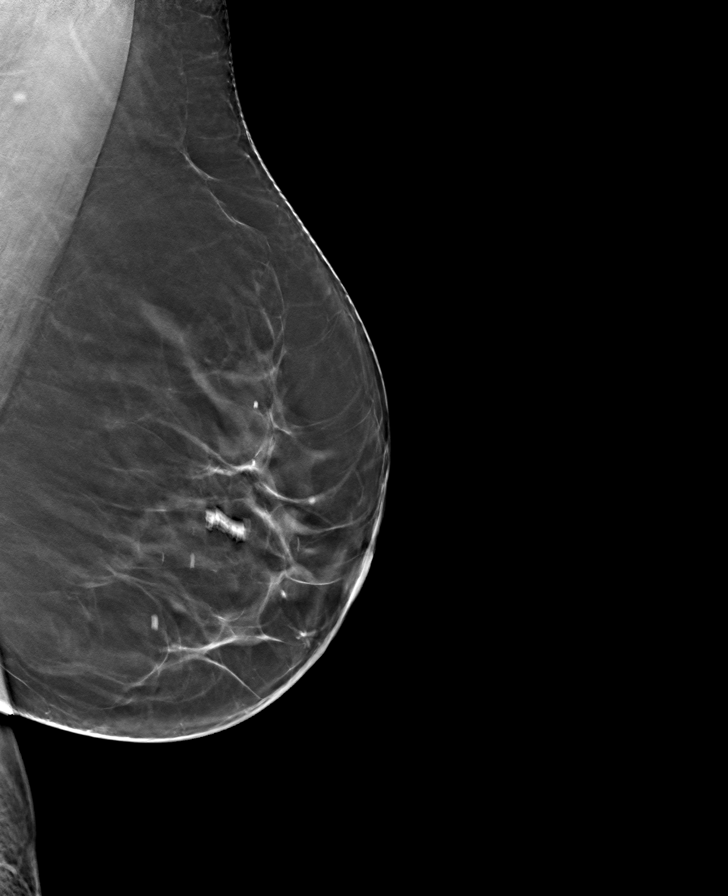

[R MLO tomo · tomo slice 35/70.0]
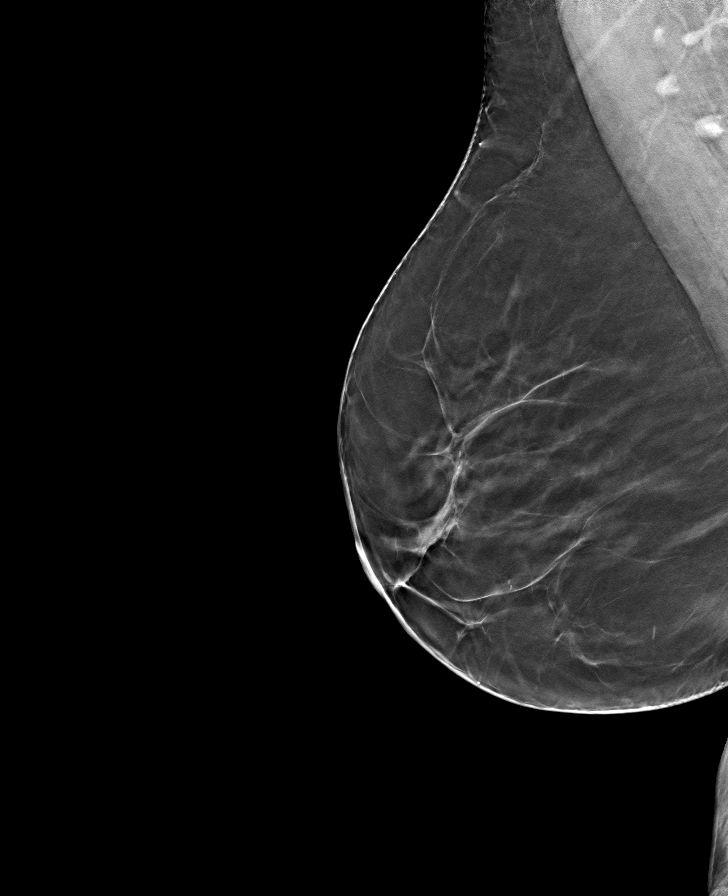

[8 of 24 positions shown; findings below may reference images not displayed]

ACR Breast Density Category b: There are scattered areas of
fibroglandular density.
FINDINGS: There are no findings suspicious for malignancy. Images were
processed with CAD.
IMPRESSION: No mammographic evidence of malignancy. A result letter of this
screening mammogram will be mailed directly to the patient.

RECOMMENDATION:
Screening mammogram in one year. (Code:[id number])

BI-RADS CATEGORY  1: Negative.

## 2021-06-20 ENCOUNTER — Other Ambulatory Visit: Payer: Self-pay | Admitting: Family Medicine

## 2021-06-20 DIAGNOSIS — M62838 Other muscle spasm: Secondary | ICD-10-CM

## 2021-06-20 DIAGNOSIS — M7541 Impingement syndrome of right shoulder: Secondary | ICD-10-CM

## 2021-06-20 DIAGNOSIS — M7542 Impingement syndrome of left shoulder: Secondary | ICD-10-CM

## 2021-06-20 DIAGNOSIS — G8929 Other chronic pain: Secondary | ICD-10-CM

## 2021-06-21 NOTE — Telephone Encounter (Signed)
Requested Prescriptions  Pending Prescriptions Disp Refills  . DULoxetine (CYMBALTA) 60 MG capsule [Pharmacy Med Name: DULOXETINE HCL DR 60 MG CAP] 90 capsule 1    Sig: TAKE 1 CAPSULE BY MOUTH EVERY DAY     Psychiatry: Antidepressants - SNRI - duloxetine Passed - 06/20/2021  9:25 AM      Passed - Cr in normal range and within 360 days    Creatinine, Ser  Date Value Ref Range Status  12/10/2020 0.68 0.57 - 1.00 mg/dL Final         Passed - eGFR is 30 or above and within 360 days    GFR calc Af Amer  Date Value Ref Range Status  12/04/2019 86 >59 mL/min/1.73 Final    Comment:    **In accordance with recommendations from the NKF-ASN Task force,**   [facility] is in the process of updating its eGFR calculation to the   2021 CKD-EPI creatinine equation that estimates kidney function   without a race variable.    GFR calc non Af Amer  Date Value Ref Range Status  12/04/2019 75 >59 mL/min/1.73 Final   eGFR  Date Value Ref Range Status  12/10/2020 98 >59 mL/min/1.73 Final         Passed - Completed PHQ-2 or PHQ-9 in the last 360 days      Passed - Last BP in normal range    BP Readings from Last 1 Encounters:  12/22/20 126/78         Passed - Valid encounter within last 6 months    Recent Outpatient Visits          6 months ago Chronic musculoskeletal pain   [facility] Montel Culver, MD   6 months ago Annual physical exam   [facility] Glean Hess, MD   7 months ago Coronavirus infection   [facility] Glean Hess, MD   7 months ago Rotator cuff impingement syndrome of right shoulder   [facility] Montel Culver, MD   7 months ago Rotator cuff impingement syndrome of right shoulder   [facility] Montel Culver, MD      Future Appointments            In 5 months Army Melia Jesse Sans, MD [facility], [facility]

## 2021-12-10 ENCOUNTER — Encounter: Payer: Self-pay | Admitting: Internal Medicine

## 2021-12-10 ENCOUNTER — Other Ambulatory Visit: Payer: Self-pay | Admitting: Internal Medicine

## 2021-12-10 DIAGNOSIS — R7303 Prediabetes: Secondary | ICD-10-CM | POA: Insufficient documentation

## 2021-12-13 ENCOUNTER — Ambulatory Visit (INDEPENDENT_AMBULATORY_CARE_PROVIDER_SITE_OTHER): Payer: PPO | Admitting: Internal Medicine

## 2021-12-13 ENCOUNTER — Encounter: Payer: Self-pay | Admitting: Internal Medicine

## 2021-12-13 VITALS — BP 122/70 | HR 93 | Ht 61.0 in | Wt 185.4 lb

## 2021-12-13 DIAGNOSIS — Z0000 Encounter for general adult medical examination without abnormal findings: Secondary | ICD-10-CM | POA: Diagnosis not present

## 2021-12-13 DIAGNOSIS — Z13820 Encounter for screening for osteoporosis: Secondary | ICD-10-CM

## 2021-12-13 DIAGNOSIS — Z23 Encounter for immunization: Secondary | ICD-10-CM | POA: Diagnosis not present

## 2021-12-13 DIAGNOSIS — E785 Hyperlipidemia, unspecified: Secondary | ICD-10-CM | POA: Diagnosis not present

## 2021-12-13 DIAGNOSIS — R7303 Prediabetes: Secondary | ICD-10-CM

## 2021-12-13 DIAGNOSIS — Z1231 Encounter for screening mammogram for malignant neoplasm of breast: Secondary | ICD-10-CM | POA: Diagnosis not present

## 2021-12-13 DIAGNOSIS — E876 Hypokalemia: Secondary | ICD-10-CM | POA: Diagnosis not present

## 2021-12-13 MED ORDER — POTASSIUM CHLORIDE CRYS ER 10 MEQ PO TBCR: 10.0000 meq | EXTENDED_RELEASE_TABLET | Freq: Every day | ORAL | 3 refills | Status: AC

## 2021-12-13 MED ORDER — SIMVASTATIN 40 MG PO TABS: 40.0000 mg | ORAL_TABLET | Freq: Every day | ORAL | 3 refills | Status: DC

## 2021-12-13 NOTE — Patient Instructions (Addendum)
Call [facility] to schedule your mammogram and Bone Density in [place] at [phone].

## 2021-12-13 NOTE — Progress Notes (Signed)
Date:  12/13/2021   Name:  Elizabeth Frye   DOB:  1955-03-20   MRN:  [id number]   Chief Complaint: Annual Exam [name] is a 66 y.o. female who presents today for her Complete Annual Exam. She feels well. She reports gardening. She reports she is sleeping well. Breast complaints - none.  Mammogram: 01/2021 DEXA: none Pap smear: 12/2018 neg/neg aged out Colonoscopy: 12/2017 [facility] - repeat 5 year  Health Maintenance Due  Topic Date Due   Medicare Annual Wellness (AWV)  Never done   Zoster Vaccines- Shingrix (1 of 2) Never done   INFLUENZA VACCINE  08/10/2021   COVID-19 Vaccine (4 - 2023-24 season) 09/10/2021   DEXA SCAN  Never done   Pneumonia Vaccine 11+ Years old (1 - PCV) 12/12/2021    Immunization History  Administered Date(s) Administered   Influenza Inj Mdck Quad Pf 11/02/2018   Influenza,inj,Quad PF,6+ Mos 12/10/2020   Influenza-Unspecified 11/01/2019   PFIZER(Purple Top)SARS-COV-2 Vaccination 04/04/2019, 04/29/2019, 11/01/2019   Tdap 11/18/2015   Zoster, Live 11/20/2014    Hyperlipidemia This is a chronic problem. The problem is controlled. Pertinent negatives include no chest pain or shortness of breath. Current antihyperlipidemic treatment includes statins. The current treatment provides significant improvement of lipids.  Snoring - she does not have any sleep issues, wakes rested, does not fall asleep during the day and no morning HA or elevated BP.  Her husband hears only snoring, no apneas.       12/13/2021    9:41 AM  Results of the [facility] flowsheet  Sitting and reading 0  Watching TV 0  Sitting, inactive in a public place (e.g. a theatre or a meeting) 0  As a passenger in a car for an hour without a break 0  Lying down to rest in the afternoon when circumstances permit 1  Sitting and talking to someone 0  Sitting quietly after a lunch without alcohol 0  In a car, while stopped for a few minutes in traffic 0  Total score 1    Lab Results   Component Value Date   NA 144 12/10/2020   K 4.8 12/10/2020   CO2 22 12/10/2020   GLUCOSE 118 (H) 12/10/2020   BUN 16 12/10/2020   CREATININE 0.68 12/10/2020   CALCIUM 9.9 12/10/2020   EGFR 98 12/10/2020   GFRNONAA 75 12/04/2019   Lab Results  Component Value Date   CHOL 184 12/10/2020   HDL 52 12/10/2020   LDLCALC 92 12/10/2020   TRIG 238 (H) 12/10/2020   CHOLHDL 3.5 12/10/2020   Lab Results  Component Value Date   TSH 2.020 12/04/2019   Lab Results  Component Value Date   HGBA1C 6.0 (H) 12/10/2020   Lab Results  Component Value Date   WBC 8.0 12/10/2020   HGB 15.2 12/10/2020   HCT 46.2 12/10/2020   MCV 89 12/10/2020   PLT 274 12/10/2020   Lab Results  Component Value Date   ALT 17 12/10/2020   AST 16 12/10/2020   ALKPHOS 100 12/10/2020   BILITOT 0.5 12/10/2020   No results found for: "25OHVITD2", "25OHVITD3", "VD25OH"   Review of Systems  Constitutional:  Negative for chills, fatigue and fever.  HENT:  Negative for congestion, hearing loss, tinnitus, trouble swallowing and voice change.   Eyes:  Negative for visual disturbance.  Respiratory:  Negative for cough, chest tightness, shortness of breath and wheezing.   Cardiovascular:  Negative for chest pain, palpitations and leg swelling.  Gastrointestinal:  Negative for abdominal pain, constipation, diarrhea and vomiting.  Endocrine: Negative for polydipsia and polyuria.  Genitourinary:  Negative for dysuria, frequency, genital sores, vaginal bleeding and vaginal discharge.  Musculoskeletal:  Negative for arthralgias, gait problem and joint swelling.  Skin:  Negative for color change and rash.  Neurological:  Negative for dizziness, tremors, light-headedness and headaches.  Hematological:  Negative for adenopathy. Does not bruise/bleed easily.  Psychiatric/Behavioral:  Negative for dysphoric mood and sleep disturbance. The patient is not nervous/anxious.     Patient Active Problem List   Diagnosis Date  Noted   Prediabetes 12/10/2021   Chronic musculoskeletal pain 12/22/2020   Rotator cuff impingement syndrome of right shoulder 10/27/2020   Rotator cuff impingement syndrome, left 10/27/2020   Cervical paraspinal muscle spasm 10/27/2020   Hyperlipidemia, mild 12/26/2018   BMI 33.0-33.9,adult 12/26/2018   Benign neoplasm of cecum    Benign neoplasm of transverse colon     Allergies  Allergen Reactions   Percocet [Oxycodone-Acetaminophen] Nausea Only    Dizziness   Sulfa Antibiotics Rash    Past Surgical History:  Procedure Laterality Date   ABDOMINAL HYSTERECTOMY     still have cervix and one ovary   BREAST CYST EXCISION Right    neg   COLONOSCOPY WITH PROPOFOL N/A 01/05/2018   Procedure: COLONOSCOPY WITH PROPOFOL;  Surgeon: Lucilla Lame, MD;  Location: [facility];  Service: Endoscopy;  Laterality: N/A;   POLYPECTOMY  01/05/2018   Procedure: POLYPECTOMY INTESTINAL;  Surgeon: Lucilla Lame, MD;  Location: [facility];  Service: Endoscopy;;   PROLAPSED UTERINE FIBROID LIGATION      Social History   Tobacco Use   Smoking status: Never   Smokeless tobacco: Never  Vaping Use   Vaping Use: Never used  Substance Use Topics   Alcohol use: Not Currently    Alcohol/week: 3.0 standard drinks of alcohol    Types: 3 Glasses of wine per week   Drug use: Never     Medication list has been reviewed and updated.  Current Meds  Medication Sig   potassium chloride (KLOR-CON M) 10 MEQ tablet Take by mouth.   simvastatin (ZOCOR) 40 MG tablet Take 1 tablet (40 mg total) by mouth daily.   [DISCONTINUED] diclofenac (VOLTAREN) 50 MG EC tablet Take by mouth.   [DISCONTINUED] POTASSIUM PO Take 1 tablet by mouth daily.       12/22/2020   10:08 AM 11/10/2020   10:49 AM 10/30/2020   11:19 AM 10/27/2020   11:53 AM  GAD 7 : Generalized Anxiety Score  Nervous, Anxious, on Edge 0 0 0 0  Control/stop worrying 0 0 0 0  Worry too much - different things 0 0 0 0  Trouble  relaxing 0 0 0 0  Restless 0 0 0 0  Easily annoyed or irritable 0 0 0 0  Afraid - awful might happen 0 0 0 0  Total GAD 7 Score 0 0 0 0  Anxiety Difficulty  Not difficult at all Not difficult at all Not difficult at all       12/22/2020   10:08 AM 11/10/2020   10:49 AM 10/30/2020   11:19 AM  Depression screen PHQ 2/9  Decreased Interest 0 0 0  Down, Depressed, Hopeless 0 0 0  PHQ - 2 Score 0 0 0  Altered sleeping 0 0 0  Tired, decreased energy 0 0 0  Change in appetite 0 0 0  Feeling bad or failure about yourself  0  0 0  Trouble concentrating 0 0 0  Moving slowly or fidgety/restless 0 0 0  Suicidal thoughts 0 0 0  PHQ-9 Score 0 0 0  Difficult doing work/chores Not difficult at all Not difficult at all Not difficult at all    BP Readings from Last 3 Encounters:  12/13/21 122/70  12/22/20 126/78  12/10/20 134/82    Physical Exam Vitals and nursing note reviewed.  Constitutional:      General: She is not in acute distress.    Appearance: She is well-developed.  HENT:     Head: Normocephalic and atraumatic.     Right Ear: Tympanic membrane and ear canal normal.     Left Ear: Tympanic membrane and ear canal normal.     Nose:     Right Sinus: No maxillary sinus tenderness.     Left Sinus: No maxillary sinus tenderness.  Eyes:     General: No scleral icterus.       Right eye: No discharge.        Left eye: No discharge.     Conjunctiva/sclera: Conjunctivae normal.  Neck:     Thyroid: No thyromegaly.     Vascular: No carotid bruit.  Cardiovascular:     Rate and Rhythm: Normal rate and regular rhythm.     Pulses: Normal pulses.     Heart sounds: Normal heart sounds.  Pulmonary:     Effort: Pulmonary effort is normal. No respiratory distress.     Breath sounds: No wheezing.  Chest:  Breasts:    Right: No mass, nipple discharge, skin change or tenderness.     Left: No mass, nipple discharge, skin change or tenderness.  Abdominal:     General: Bowel sounds are  normal.     Palpations: Abdomen is soft.     Tenderness: There is no abdominal tenderness.  Musculoskeletal:     Cervical back: Normal range of motion. No erythema.     Right lower leg: No edema.     Left lower leg: No edema.  Lymphadenopathy:     Cervical: No cervical adenopathy.  Skin:    General: Skin is warm and dry.     Findings: No rash.  Neurological:     Mental Status: She is alert and oriented to person, place, and time.     Cranial Nerves: No cranial nerve deficit.     Sensory: No sensory deficit.     Deep Tendon Reflexes: Reflexes are normal and symmetric.  Psychiatric:        Attention and Perception: Attention normal.        Mood and Affect: Mood normal.     Wt Readings from Last 3 Encounters:  12/13/21 185 lb 6.4 oz (84.1 kg)  12/22/20 180 lb (81.6 kg)  12/10/20 178 lb (80.7 kg)    BP 122/70   Pulse 93   Ht _0  (1.549 m)   Wt 185 lb 6.4 oz (84.1 kg)   SpO2 97%   BMI 35.03 kg/m   Assessment and Plan: 1. Annual physical exam Normal exam except for weight. Work on diet changes and exercise - CBC with Differential/Platelet - Comprehensive metabolic panel - Lipid panel  2. Encounter for screening mammogram for breast cancer Schedule at [facility] - MM 3D SCREEN BREAST BILATERAL  3. Hyperlipidemia, mild Tolerating statin medication without side effects at this time LDL is at goal of < 70 on current dose Continue same therapy without change at this time. - Lipid panel -  simvastatin (ZOCOR) 40 MG tablet; Take 1 tablet (40 mg total) by mouth daily.  Dispense: 90 tablet; Refill: 3  4. Prediabetes Work on diet and weight loss. - Comprehensive metabolic panel - Hemoglobin A1c  5. Hypokalemia supplemented - potassium chloride (KLOR-CON M) 10 MEQ tablet; Take 1 tablet (10 mEq total) by mouth daily.  Dispense: 90 tablet; Refill: 3  6. Need for vaccination for pneumococcus - Pneumococcal conjugate vaccine 20-valent  7. Encounter for screening for  osteoporosis Schedule at [facility] - DG Bone Density   Partially dictated using Editor, commissioning. Any errors are unintentional.  Halina Maidens, MD [facility]  12/13/2021

## 2021-12-14 LAB — CBC WITH DIFFERENTIAL/PLATELET
Basophils Absolute: 0 10*3/uL (ref 0.0–0.2)
Basos: 1 %
EOS (ABSOLUTE): 0.2 10*3/uL (ref 0.0–0.4)
Eos: 3 %
Hematocrit: 46.6 % (ref 34.0–46.6)
Hemoglobin: 15.7 g/dL (ref 11.1–15.9)
Immature Grans (Abs): 0 10*3/uL (ref 0.0–0.1)
Immature Granulocytes: 0 %
Lymphocytes Absolute: 2 10*3/uL (ref 0.7–3.1)
Lymphs: 32 %
MCH: 30.4 pg (ref 26.6–33.0)
MCHC: 33.7 g/dL (ref 31.5–35.7)
MCV: 90 fL (ref 79–97)
Monocytes Absolute: 0.6 10*3/uL (ref 0.1–0.9)
Monocytes: 9 %
Neutrophils Absolute: 3.4 10*3/uL (ref 1.4–7.0)
Neutrophils: 55 %
Platelets: 230 10*3/uL (ref 150–450)
RBC: 5.17 x10E6/uL (ref 3.77–5.28)
RDW: 12.4 % (ref 11.7–15.4)
WBC: 6.1 10*3/uL (ref 3.4–10.8)

## 2021-12-14 LAB — COMPREHENSIVE METABOLIC PANEL
ALT: 19 IU/L (ref 0–32)
AST: 22 IU/L (ref 0–40)
Albumin/Globulin Ratio: 2.3 — ABNORMAL HIGH (ref 1.2–2.2)
Albumin: 4.8 g/dL (ref 3.9–4.9)
Alkaline Phosphatase: 109 IU/L (ref 44–121)
BUN/Creatinine Ratio: 21 (ref 12–28)
BUN: 17 mg/dL (ref 8–27)
Bilirubin Total: 0.3 mg/dL (ref 0.0–1.2)
CO2: 21 mmol/L (ref 20–29)
Calcium: 10 mg/dL (ref 8.7–10.3)
Chloride: 105 mmol/L (ref 96–106)
Creatinine, Ser: 0.81 mg/dL (ref 0.57–1.00)
Globulin, Total: 2.1 g/dL (ref 1.5–4.5)
Glucose: 115 mg/dL — ABNORMAL HIGH (ref 70–99)
Potassium: 4.9 mmol/L (ref 3.5–5.2)
Sodium: 142 mmol/L (ref 134–144)
Total Protein: 6.9 g/dL (ref 6.0–8.5)
eGFR: 81 mL/min/{1.73_m2} (ref >59)

## 2021-12-14 LAB — LIPID PANEL
Chol/HDL Ratio: 3.7 ratio (ref 0.0–4.4)
Cholesterol, Total: 199 mg/dL (ref 100–199)
HDL: 54 mg/dL (ref >39)
LDL Chol Calc (NIH): 107 mg/dL — ABNORMAL HIGH (ref 0–99)
Triglycerides: 221 mg/dL — ABNORMAL HIGH (ref 0–149)
VLDL Cholesterol Cal: 38 mg/dL (ref 5–40)

## 2021-12-14 LAB — HEMOGLOBIN A1C
Est. average glucose Bld gHb Est-mCnc: 128 mg/dL
Hgb A1c MFr Bld: 6.1 % — ABNORMAL HIGH (ref 4.8–5.6)

## 2022-01-18 ENCOUNTER — Ambulatory Visit
Admission: RE | Admit: 2022-01-18 | Discharge: 2022-01-18 | Disposition: A | Discharge status: Home / Self Care | Payer: PPO | Source: Ambulatory Visit | Attending: Internal Medicine | Admitting: Internal Medicine

## 2022-01-18 ENCOUNTER — Encounter: Payer: Self-pay | Admitting: Internal Medicine

## 2022-01-18 DIAGNOSIS — M8580 Other specified disorders of bone density and structure, unspecified site: Secondary | ICD-10-CM | POA: Insufficient documentation

## 2022-01-18 DIAGNOSIS — Z780 Asymptomatic menopausal state: Secondary | ICD-10-CM | POA: Insufficient documentation

## 2022-01-18 DIAGNOSIS — Z13820 Encounter for screening for osteoporosis: Secondary | ICD-10-CM | POA: Insufficient documentation

## 2022-01-18 DIAGNOSIS — Z1231 Encounter for screening mammogram for malignant neoplasm of breast: Secondary | ICD-10-CM | POA: Diagnosis not present

## 2022-01-18 DIAGNOSIS — M85852 Other specified disorders of bone density and structure, left thigh: Secondary | ICD-10-CM | POA: Insufficient documentation

## 2022-03-14 ENCOUNTER — Telehealth: Payer: Self-pay | Admitting: Internal Medicine

## 2022-03-14 NOTE — Telephone Encounter (Signed)
Contacted Lisha Daves to schedule their annual wellness visit. Appointment made for 03/31/2022.  Sherol Dade; Care Guide [facility] Direct Dial: [phone]

## 2022-03-31 ENCOUNTER — Ambulatory Visit (INDEPENDENT_AMBULATORY_CARE_PROVIDER_SITE_OTHER): Payer: PPO

## 2022-03-31 VITALS — Ht 60.0 in | Wt 185.0 lb

## 2022-03-31 DIAGNOSIS — Z0000 Encounter for general adult medical examination without abnormal findings: Secondary | ICD-10-CM

## 2022-03-31 NOTE — Patient Instructions (Signed)
Ms. Elizabeth Frye , Thank you for taking time to come for your Medicare Wellness Visit. I appreciate your ongoing commitment to your health goals. Please review the following plan we discussed and let me know if I can assist you in the future.   These are the goals we discussed:  Goals      DIET - EAT MORE FRUITS AND VEGETABLES        This is a list of the screening recommended for you and due dates:  Health Maintenance  Topic Date Due   Zoster (Shingles) Vaccine (1 of 2) Never done   COVID-19 Vaccine (4 - 2023-24 season) 09/10/2021   Colon Cancer Screening  01/06/2023   Medicare Annual Wellness Visit  03/31/2023   Pap Smear  12/14/2023   Mammogram  01/19/2024   DTaP/Tdap/Td vaccine (2 - Td or Tdap) 11/17/2025   Pneumonia Vaccine  Completed   Flu Shot  Completed   DEXA scan (bone density measurement)  Completed   Hepatitis C Screening: USPSTF Recommendation to screen - Ages 16-79 yo.  Completed   HIV Screening  Completed   HPV Vaccine  Aged Out    Advanced directives: no  Conditions/risks identified: none  Next appointment: Follow up in one year for your annual wellness visit 04/05/23 @ 2:30 pm by phone   Preventive Care 65 Years and Older, Female Preventive care refers to lifestyle choices and visits with your health care provider that can promote health and wellness. What does preventive care include? A yearly physical exam. This is also called an annual well check. Dental exams once or twice a year. Routine eye exams. Ask your health care provider how often you should have your eyes checked. Personal lifestyle choices, including: Daily care of your teeth and gums. Regular physical activity. Eating a healthy diet. Avoiding tobacco and drug use. Limiting alcohol use. Practicing safe sex. Taking low-dose aspirin every day. Taking vitamin and mineral supplements as recommended by your health care provider. What happens during an annual well check? The services and  screenings done by your health care provider during your annual well check will depend on your age, overall health, lifestyle risk factors, and family history of disease. Counseling  Your health care provider may ask you questions about your: Alcohol use. Tobacco use. Drug use. Emotional well-being. Home and relationship well-being. Sexual activity. Eating habits. History of falls. Memory and ability to understand (cognition). Work and work Statistician. Reproductive health. Screening  You may have the following tests or measurements: Height, weight, and BMI. Blood pressure. Lipid and cholesterol levels. These may be checked every 5 years, or more frequently if you are over 49 years old. Skin check. Lung cancer screening. You may have this screening every year starting at age 66 if you have a 30-pack-year history of smoking and currently smoke or have quit within the past 15 years. Fecal occult blood test (FOBT) of the stool. You may have this test every year starting at age 61. Flexible sigmoidoscopy or colonoscopy. You may have a sigmoidoscopy every 5 years or a colonoscopy every 10 years starting at age 62. Hepatitis C blood test. Hepatitis B blood test. Sexually transmitted disease (STD) testing. Diabetes screening. This is done by checking your blood sugar (glucose) after you have not eaten for a while (fasting). You may have this done every 1-3 years. Bone density scan. This is done to screen for osteoporosis. You may have this done starting at age 37. Mammogram. This may be done every 1-2  years. Talk to your health care provider about how often you should have regular mammograms. Talk with your health care provider about your test results, treatment options, and if necessary, the need for more tests. Vaccines  Your health care provider may recommend certain vaccines, such as: Influenza vaccine. This is recommended every year. Tetanus, diphtheria, and acellular pertussis (Tdap,  Td) vaccine. You may need a Td booster every 10 years. Zoster vaccine. You may need this after age 38. Pneumococcal 13-valent conjugate (PCV13) vaccine. One dose is recommended after age 80. Pneumococcal polysaccharide (PPSV23) vaccine. One dose is recommended after age 37. Talk to your health care provider about which screenings and vaccines you need and how often you need them. This information is not intended to replace advice given to you by your health care provider. Make sure you discuss any questions you have with your health care provider. Document Released: 01/23/2015 Document Revised: 09/16/2015 Document Reviewed: 10/28/2014 Elsevier Interactive Patient Education  2017 [place] Prevention in the Home Falls can cause injuries. They can happen to people of all ages. There are many things you can do to make your home safe and to help prevent falls. What can I do on the outside of my home? Regularly fix the edges of walkways and driveways and fix any cracks. Remove anything that might make you trip as you walk through a door, such as a raised step or threshold. Trim any bushes or trees on the path to your home. Use bright outdoor lighting. Clear any walking paths of anything that might make someone trip, such as rocks or tools. Regularly check to see if handrails are loose or broken. Make sure that both sides of any steps have handrails. Any raised decks and porches should have guardrails on the edges. Have any leaves, snow, or ice cleared regularly. Use sand or salt on walking paths during winter. Clean up any spills in your garage right away. This includes oil or grease spills. What can I do in the bathroom? Use night lights. Install grab bars by the toilet and in the tub and shower. Do not use towel bars as grab bars. Use non-skid mats or decals in the tub or shower. If you need to sit down in the shower, use a plastic, non-slip stool. Keep the floor dry. Clean up any  water that spills on the floor as soon as it happens. Remove soap buildup in the tub or shower regularly. Attach bath mats securely with double-sided non-slip rug tape. Do not have throw rugs and other things on the floor that can make you trip. What can I do in the bedroom? Use night lights. Make sure that you have a light by your bed that is easy to reach. Do not use any sheets or blankets that are too big for your bed. They should not hang down onto the floor. Have a firm chair that has side arms. You can use this for support while you get dressed. Do not have throw rugs and other things on the floor that can make you trip. What can I do in the kitchen? Clean up any spills right away. Avoid walking on wet floors. Keep items that you use a lot in easy-to-reach places. If you need to reach something above you, use a strong step stool that has a grab bar. Keep electrical cords out of the way. Do not use floor polish or wax that makes floors slippery. If you must use wax, use non-skid floor  wax. Do not have throw rugs and other things on the floor that can make you trip. What can I do with my stairs? Do not leave any items on the stairs. Make sure that there are handrails on both sides of the stairs and use them. Fix handrails that are broken or loose. Make sure that handrails are as long as the stairways. Check any carpeting to make sure that it is firmly attached to the stairs. Fix any carpet that is loose or worn. Avoid having throw rugs at the top or bottom of the stairs. If you do have throw rugs, attach them to the floor with carpet tape. Make sure that you have a light switch at the top of the stairs and the bottom of the stairs. If you do not have them, ask someone to add them for you. What else can I do to help prevent falls? Wear shoes that: Do not have high heels. Have rubber bottoms. Are comfortable and fit you well. Are closed at the toe. Do not wear sandals. If you use a  stepladder: Make sure that it is fully opened. Do not climb a closed stepladder. Make sure that both sides of the stepladder are locked into place. Ask someone to hold it for you, if possible. Clearly mark and make sure that you can see: Any grab bars or handrails. First and last steps. Where the edge of each step is. Use tools that help you move around (mobility aids) if they are needed. These include: Canes. Walkers. Scooters. Crutches. Turn on the lights when you go into a dark area. Replace any light bulbs as soon as they burn out. Set up your furniture so you have a clear path. Avoid moving your furniture around. If any of your floors are uneven, fix them. If there are any pets around you, be aware of where they are. Review your medicines with your doctor. Some medicines can make you feel dizzy. This can increase your chance of falling. Ask your doctor what other things that you can do to help prevent falls. This information is not intended to replace advice given to you by your health care provider. Make sure you discuss any questions you have with your health care provider. Document Released: 10/23/2008 Document Revised: 06/04/2015 Document Reviewed: 01/31/2014 Elsevier Interactive Patient Education  2017 [employer].

## 2022-03-31 NOTE — Progress Notes (Signed)
I connected with  Elizabeth Frye on 03/31/22 by a audio enabled telemedicine application and verified that I am speaking with the correct person using two identifiers.  Patient Location: Home  Provider Location: Office/Clinic  I discussed the limitations of evaluation and management by telemedicine. The patient expressed understanding and agreed to proceed.  Subjective:   Elizabeth Frye is a 67 y.o. female who presents for Medicare Annual (Subsequent) preventive examination.  Review of Systems     Cardiac Risk Factors include: advanced age (>82men, >31 women)     Objective:    There were no vitals filed for this visit. There is no height or weight on file to calculate BMI.     03/31/2022    8:17 AM 11/18/2020    1:59 PM 01/05/2018    8:16 AM 05/01/2016    2:52 PM  Advanced Directives  Does Patient Have a Medical Advance Directive? No Yes No No  Would patient like information on creating a medical advance directive? No - Patient declined  No - Patient declined No - Patient declined    Current Medications (verified) Outpatient Encounter Medications as of 03/31/2022  Medication Sig   potassium chloride (KLOR-CON M) 10 MEQ tablet Take 1 tablet (10 mEq total) by mouth daily.   simvastatin (ZOCOR) 40 MG tablet Take 1 tablet (40 mg total) by mouth daily.   No facility-administered encounter medications on file as of 03/31/2022.    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   after injury to leg   History of pulmonary embolus (PE) 1997   Hyperlipidemia    Past Surgical History:  Procedure Laterality Date   ABDOMINAL HYSTERECTOMY     still have cervix and one ovary   BREAST CYST EXCISION Right    neg   COLONOSCOPY WITH PROPOFOL N/A 01/05/2018   Procedure: COLONOSCOPY WITH PROPOFOL;  Surgeon: Lucilla Lame, MD;  Location: [place];   Service: Endoscopy;  Laterality: N/A;   POLYPECTOMY  01/05/2018   Procedure: POLYPECTOMY INTESTINAL;  Surgeon: Lucilla Lame, MD;  Location: [place];  Service: Endoscopy;;   PROLAPSED UTERINE FIBROID LIGATION     Family History  Problem Relation Age of Onset   Diabetes Mother    Heart attack Mother    Diabetes Sister    Cancer Maternal Aunt    Breast cancer Maternal Aunt    Heart attack Maternal Aunt    Social History   Socioeconomic History   Marital status: Married    Spouse name: Darion Manter   Number of children: 2   Years of education: 16   Highest education level: Bachelor's degree (e.g., BA, AB, BS)  Occupational History   Not on file  Tobacco Use   Smoking status: Never   Smokeless tobacco: Never  Vaping Use   Vaping Use: Never used  Substance and Sexual Activity   Alcohol use: Not Currently    Alcohol/week: 3.0 standard drinks of alcohol    Types: 3 Glasses of wine per week   Drug use: Never   Sexual activity: Yes    Partners: Male  Other Topics Concern   Not on file  Social History Narrative   Not on file   Social Determinants of Health   Financial Resource Strain: Low Risk  (03/31/2022)   Overall Financial Resource Strain (CARDIA)    Difficulty of Paying Living Expenses: Not hard at all  Food Insecurity: No Food Insecurity (03/31/2022)   Hunger Vital Sign    Worried About Running Out of Food in the Last Year: Never true    Ran Out of Food in the Last Year: Never true  Transportation Needs: No Transportation Needs (03/31/2022)   PRAPARE - Hydrologist (Medical): No    Lack of Transportation (Non-Medical): No  Physical Activity: Inactive (03/31/2022)   Exercise Vital Sign    Days of Exercise per Week: 0 days    Minutes of Exercise per Session: 0 min  Stress: No Stress Concern Present (03/31/2022)   [place]    Feeling of Stress : Not at all   Social Connections: Moderately Integrated (03/31/2022)   Social Connection and Isolation Panel [NHANES]    Frequency of Communication with Friends and Family: Once a week    Frequency of Social Gatherings with Friends and Family: Three times a week    Attends Religious Services: More than 4 times per year    Active Member of Clubs or Organizations: No    Attends Archivist Meetings: Never    Marital Status: Married    Tobacco Counseling Counseling given: Not Answered   Clinical Intake:  Pre-visit preparation completed: Yes  Pain : No/denies pain     Nutritional Risks: None Diabetes: No  How often do you need to have someone help you when you read instructions, pamphlets, or other written materials from your doctor or pharmacy?: 1 - Never  Diabetic?no  Interpreter Needed?: No  Information entered by :: Kirke Shaggy, LPN   Activities of Daily Living    03/31/2022    8:18 AM 03/28/2022    8:31 AM  In your present state of health, do you have any difficulty performing the following activities:  Hearing? 0 0  Vision? 0 0  Difficulty concentrating or making decisions? 0 0  Walking or climbing stairs? 0 0  Dressing or bathing? 0 0  Doing errands, shopping? 0 0  Preparing Food and eating ? N N  Using the Toilet? N N  In the past six months, have you accidently leaked urine? N N  Do you have problems with loss of bowel control? N N  Managing your Medications? N N  Managing your Finances? N N  Housekeeping or managing your Housekeeping? N N    Patient Care Team: Glean Hess, MD as PCP - General (Internal Medicine)  Indicate any recent Medical Services you may have received from other than [facility] providers in the past year (date may be approximate).     Assessment:   This is a routine wellness examination for Elizabeth Frye.  Hearing/Vision screen Hearing Screening - Comments:: No aids Vision Screening - Comments:: Readers- Dr.Shade  Dietary issues and  exercise activities discussed: Current Exercise Habits: The patient does not participate in regular exercise at present   Goals Addressed             This Visit's Progress    DIET - EAT MORE FRUITS AND VEGETABLES         Depression Screen    03/31/2022    8:15 AM 12/13/2021    9:22 AM 12/22/2020   10:08 AM 11/10/2020   10:49 AM 10/30/2020   11:19 AM 10/27/2020   11:52 AM 01/02/2020    9:59 AM  PHQ 2/9 Scores  PHQ - 2 Score 0 0 0 0 0 0 0  PHQ- 9 Score  0 0 0 0 0 0 0    Fall Risk    03/31/2022    8:18 AM 03/28/2022    8:31 AM 12/13/2021    9:22 AM 12/22/2020   10:09 AM 11/10/2020   10:48 AM  Fall Risk   Falls in the past year? 0 0 0 0 0  Number falls in past yr: 0  0 0 0  Injury with Fall? 0  0 0 0  Risk for fall due to : No Fall Risks  No Fall Risks No Fall Risks Orthopedic patient  Follow up Falls prevention discussed;Falls evaluation completed  Falls evaluation completed Falls evaluation completed Falls evaluation completed    FALL RISK PREVENTION PERTAINING TO THE HOME:  Any stairs in or around the home? Yes  If so, are there any without handrails? No  Home free of loose throw rugs in walkways, pet beds, electrical cords, etc? Yes  Adequate lighting in your home to reduce risk of falls? Yes   ASSISTIVE DEVICES UTILIZED TO PREVENT FALLS:  Life alert? No  Use of a cane, walker or w/c? No  Grab bars in the bathroom? No  Shower chair or bench in shower? Yes  Elevated toilet seat or a handicapped toilet? No   Cognitive Function:        03/31/2022    8:22 AM  6CIT Screen  What Year? 0 points  What month? 0 points  What time? 0 points  Count back from 20 0 points  Months in reverse 0 points  Repeat phrase 0 points  Total Score 0 points    Immunizations Immunization History  Administered Date(s) Administered   Fluad Quad(high Dose 65+) 12/13/2021   Influenza Inj Mdck Quad Pf 11/02/2018   Influenza,inj,Quad PF,6+ Mos 12/10/2020   Influenza-Unspecified  11/01/2019   PFIZER(Purple Top)SARS-COV-2 Vaccination 04/04/2019, 04/29/2019, 11/01/2019   PNEUMOCOCCAL CONJUGATE-20 12/13/2021   Tdap 11/18/2015   Zoster, Live 11/20/2014    TDAP status: Up to date  Flu Vaccine status: Up to date  Pneumococcal vaccine status: Up to date  Covid-19 vaccine status: Completed vaccines  Qualifies for Shingles Vaccine? Yes   Zostavax completed Yes   Shingrix Completed?: No.    Education has been provided regarding the importance of this vaccine. Patient has been advised to call insurance company to determine out of pocket expense if they have not yet received this vaccine. Advised may also receive vaccine at local pharmacy or Health Dept. Verbalized acceptance and understanding.  Screening Tests Health Maintenance  Topic Date Due   Zoster Vaccines- Shingrix (1 of 2) Never done   COVID-19 Vaccine (4 - 2023-24 season) 09/10/2021   COLONOSCOPY (Pts 45-68yrs Insurance coverage will need to be confirmed)  01/06/2023   Medicare Annual Wellness (AWV)  03/31/2023   PAP SMEAR-Modifier  12/14/2023   MAMMOGRAM  01/19/2024   DTaP/Tdap/Td (2 - Td or Tdap) 11/17/2025   Pneumonia Vaccine 7+ Years old  Completed   INFLUENZA VACCINE  Completed   DEXA SCAN  Completed   Hepatitis C Screening  Completed   HIV Screening  Completed   HPV VACCINES  Aged Out    Health Maintenance  Health Maintenance Due  Topic Date Due   Zoster Vaccines- Shingrix (1 of 2) Never done   COVID-19 Vaccine (4 - 2023-24 season) 09/10/2021    Colorectal cancer screening: Type of screening: Colonoscopy. Completed 01/05/18. Repeat every 5 years  Mammogram status: Completed 01/18/22. Repeat every year  Bone Density status: Completed 01/18/22. Results reflect:  Bone density results: OSTEOPENIA. Repeat every 5 years.  Lung Cancer Screening: (Low Dose CT Chest recommended if Age 52-80 years, 30 pack-year currently smoking OR have quit w/in 15years.) does not qualify.    Additional  Screening:  Hepatitis C Screening: does qualify; Completed 12/04/19  Vision Screening: Recommended annual ophthalmology exams for early detection of glaucoma and other disorders of the eye. Is the patient up to date with their annual eye exam?  Yes  Who is the provider or what is the name of the office in which the patient attends annual eye exams? Dr.Shade If pt is not established with a provider, would they like to be referred to a provider to establish care? No .   Dental Screening: Recommended annual dental exams for proper oral hygiene  Community Resource Referral / Chronic Care Management: CRR required this visit?  No   CCM required this visit?  No      Plan:     I have personally reviewed and noted the following in the patient's chart:   Medical and social history Use of alcohol, tobacco or illicit drugs  Current medications and supplements including opioid prescriptions. Patient is not currently taking opioid prescriptions. Functional ability and status Nutritional status Physical activity Advanced directives List of other physicians Hospitalizations, surgeries, and ER visits in previous 12 months Vitals Screenings to include cognitive, depression, and falls Referrals and appointments  In addition, I have reviewed and discussed with patient certain preventive protocols, quality metrics, and best practice recommendations. A written personalized care plan for preventive services as well as general preventive health recommendations were provided to patient.     Dionisio David, LPN   [id number]   Nurse Notes: none

## 2022-08-01 ENCOUNTER — Telehealth: Payer: Self-pay

## 2022-08-01 ENCOUNTER — Other Ambulatory Visit: Payer: Self-pay

## 2022-08-01 DIAGNOSIS — Z86010 Personal history of colonic polyps: Secondary | ICD-10-CM

## 2022-08-01 MED ORDER — NA SULFATE-K SULFATE-MG SULF 17.5-3.13-1.6 GM/177ML PO SOLN: 1.0000 | Freq: Once | ORAL | 0 refills | Status: AC

## 2022-08-01 NOTE — Telephone Encounter (Signed)
Pt rcved letter to schedule 5 year return colonoscopy please return call

## 2022-08-01 NOTE — Telephone Encounter (Signed)
Gastroenterology Pre-Procedure Review  Request Date: 01/06/23 Requesting Physician: Dr. Servando Snare  PATIENT REVIEW QUESTIONS: The patient responded to the following health history questions as indicated:    1. Are you having any GI issues? no 2. Do you have a personal history of Polyps? yes (01/05/2018 colonoscopy performed by Dr. Servando Snare) 3. Do you have a family history of Colon Cancer or Polyps? no 4. Diabetes Mellitus? no 5. Joint replacements in the past 12 months?no 6. Major health problems in the past 3 months?no 7. Any artificial heart valves, MVP, or defibrillator?no    MEDICATIONS & ALLERGIES:    Patient reports the following regarding taking any anticoagulation/antiplatelet therapy:   Plavix, Coumadin, Eliquis, Xarelto, Lovenox, Pradaxa, Brilinta, or Effient? no Aspirin? no  Patient confirms/reports the following medications:  Current Outpatient Medications  Medication Sig Dispense Refill   potassium chloride (KLOR-CON M) 10 MEQ tablet Take 1 tablet (10 mEq total) by mouth daily. 90 tablet 3   simvastatin (ZOCOR) 40 MG tablet Take 1 tablet (40 mg total) by mouth daily. 90 tablet 3   No current facility-administered medications for this visit.    Patient confirms/reports the following allergies:  Allergies  Allergen Reactions   Percocet [Oxycodone-Acetaminophen] Nausea Only    Dizziness   Sulfa Antibiotics Rash    No orders of the defined types were placed in this encounter.   AUTHORIZATION INFORMATION Primary Insurance: 1D#: Group #:  Secondary Insurance: 1D#: Group #:  SCHEDULE INFORMATION: Date: 01/06/23 Time: Location: [facility]

## 2022-08-31 ENCOUNTER — Encounter: Payer: Self-pay | Admitting: Physician Assistant

## 2022-08-31 ENCOUNTER — Ambulatory Visit (INDEPENDENT_AMBULATORY_CARE_PROVIDER_SITE_OTHER): Payer: PPO | Admitting: Physician Assistant

## 2022-08-31 VITALS — BP 124/78 | HR 93 | Temp 98.3°F | Ht 60.0 in | Wt 188.0 lb

## 2022-08-31 DIAGNOSIS — M67431 Ganglion, right wrist: Secondary | ICD-10-CM

## 2022-08-31 DIAGNOSIS — M67432 Ganglion, left wrist: Secondary | ICD-10-CM

## 2022-08-31 NOTE — Progress Notes (Signed)
Date:  08/31/2022   Name:  Elizabeth Frye   DOB:  08/26/1955   MRN:  [id number]   Chief Complaint: Mass (X1 month On wrist, both wrist,right one disappeared, no change in size or color, painful )  HPI Elizabeth Frye is a very pleasant 67 year old female new to me today, typically sees my colleague Dr. Bari Edward, MD for routine care, here for evaluation of bilateral "cysts" on the wrists.  The one on the right appeared 1 to 2 months ago and has mostly resolved spontaneously at the time of exam, the one on the left appeared about 10 days ago and is currently painful.  She believes this occurred due to relative overuse of the left wrist in order to rest the right wrist and allow it to recover.  She is taking ibuprofen with benefit.   Medication list has been reviewed and updated.  Current Meds  Medication Sig   Calcium Carbonate-Vitamin D (CALCIUM-VITAMIN D) 600-3.125 MG-MCG TABS Take by mouth.   potassium chloride (KLOR-CON M) 10 MEQ tablet Take 1 tablet (10 mEq total) by mouth daily.   simvastatin (ZOCOR) 40 MG tablet Take 1 tablet (40 mg total) by mouth daily.     Review of Systems  Constitutional:  Negative for fatigue and fever.  Respiratory:  Negative for chest tightness and shortness of breath.   Cardiovascular:  Negative for chest pain and palpitations.  Gastrointestinal:  Negative for abdominal pain.    Patient Active Problem List   Diagnosis Date Noted   Osteopenia 01/18/2022   Prediabetes 12/10/2021   Chronic musculoskeletal pain 12/22/2020   Rotator cuff impingement syndrome of right shoulder 10/27/2020   Rotator cuff impingement syndrome, left 10/27/2020   Cervical paraspinal muscle spasm 10/27/2020   Hyperlipidemia, mild 12/26/2018   BMI 33.0-33.9,adult 12/26/2018   Benign neoplasm of cecum    Benign neoplasm of transverse colon     Allergies  Allergen Reactions   Percocet [Oxycodone-Acetaminophen] Nausea Only    Dizziness   Sulfa Antibiotics Rash     Immunization History  Administered Date(s) Administered   Fluad Quad(high Dose 65+) 12/13/2021   Influenza Inj Mdck Quad Pf 11/02/2018   Influenza,inj,Quad PF,6+ Mos 12/10/2020   Influenza-Unspecified 11/01/2019   PFIZER(Purple Top)SARS-COV-2 Vaccination 04/04/2019, 04/29/2019, 11/01/2019   PNEUMOCOCCAL CONJUGATE-20 12/13/2021   Tdap 11/18/2015   Zoster, Live 11/20/2014    Past Surgical History:  Procedure Laterality Date   ABDOMINAL HYSTERECTOMY     still have cervix and one ovary   BREAST CYST EXCISION Right    neg   COLONOSCOPY WITH PROPOFOL N/A 01/05/2018   Procedure: COLONOSCOPY WITH PROPOFOL;  Surgeon: Midge Minium, MD;  Location[facility];  Service: Endoscopy;  Laterality: N/A;   POLYPECTOMY  01/05/2018   Procedure: POLYPECTOMY INTESTINAL;  Surgeon: Midge Minium, MD;  Location: [facility];  Service: Endoscopy;;   PROLAPSED UTERINE FIBROID LIGATION      Social History   Tobacco Use   Smoking status: Never   Smokeless tobacco: Never  Vaping Use   Vaping status: Never Used  Substance Use Topics   Alcohol use: Not Currently    Alcohol/week: 3.0 standard drinks of alcohol    Types: 3 Glasses of wine per week   Drug use: Never    Family History  Problem Relation Age of Onset   Diabetes Mother    Heart attack Mother    Diabetes Sister    Cancer Maternal Aunt    Breast cancer Maternal Aunt  Heart attack Maternal Aunt         08/31/2022   10:28 AM 12/13/2021    9:22 AM 12/22/2020   10:08 AM 11/10/2020   10:49 AM  GAD 7 : Generalized Anxiety Score  Nervous, Anxious, on Edge 0 0 0 0  Control/stop worrying 0 0 0 0  Worry too much - different things 0 0 0 0  Trouble relaxing 0 0 0 0  Restless 0 0 0 0  Easily annoyed or irritable 0 0 0 0  Afraid - awful might happen 0 0 0 0  Total GAD 7 Score 0 0 0 0  Anxiety Difficulty Not difficult at all Not difficult at all  Not difficult at all       08/31/2022   10:28 AM 03/31/2022    8:15 AM  12/13/2021    9:22 AM  Depression screen PHQ 2/9  Decreased Interest 0 0 0  Down, Depressed, Hopeless 0 0 0  PHQ - 2 Score 0 0 0  Altered sleeping 0 0 0  Tired, decreased energy 0 0 0  Change in appetite 0 0 0  Feeling bad or failure about yourself  0 0 0  Trouble concentrating 0 0 0  Moving slowly or fidgety/restless 0 0 0  Suicidal thoughts 0 0 0  PHQ-9 Score 0 0 0  Difficult doing work/chores Not difficult at all Not difficult at all Not difficult at all    BP Readings from Last 3 Encounters:  08/31/22 124/78  12/13/21 122/70  12/22/20 126/78    Wt Readings from Last 3 Encounters:  08/31/22 188 lb (85.3 kg)  03/31/22 185 lb (83.9 kg)  12/13/21 185 lb 6.4 oz (84.1 kg)    BP 124/78   Pulse 93   Temp 98.3 F (36.8 C) (Oral)   Ht 5' (1.524 m)   Wt 188 lb (85.3 kg)   SpO2 98%   BMI 36.72 kg/m   Physical Exam Vitals and nursing note reviewed.  Constitutional:      Appearance: Normal appearance.  Cardiovascular:     Rate and Rhythm: Normal rate.  Pulmonary:     Effort: Pulmonary effort is normal.  Abdominal:     General: There is no distension.  Musculoskeletal:        General: Normal range of motion.     Comments: Normal exam of right wrist, 5 mm mobile tender nodule of the left lateral wrist. Full ROM bilterally.  Skin:    General: Skin is warm and dry.  Neurological:     Mental Status: She is alert and oriented to person, place, and time.     Gait: Gait is intact.  Psychiatric:        Mood and Affect: Mood and affect normal.     Recent Labs     Component Value Date/Time   NA 142 12/13/2021 1033   K 4.9 12/13/2021 1033   CL 105 12/13/2021 1033   CO2 21 12/13/2021 1033   GLUCOSE 115 (H) 12/13/2021 1033   BUN 17 12/13/2021 1033   CREATININE 0.81 12/13/2021 1033   CALCIUM 10.0 12/13/2021 1033   PROT 6.9 12/13/2021 1033   ALBUMIN 4.8 12/13/2021 1033   AST 22 12/13/2021 1033   ALT 19 12/13/2021 1033   ALKPHOS 109 12/13/2021 1033   BILITOT 0.3  12/13/2021 1033   GFRNONAA 75 12/04/2019 0939   GFRAA 86 12/04/2019 0939    Lab Results  Component Value Date   WBC 6.1 12/13/2021  HGB 15.7 12/13/2021   HCT 46.6 12/13/2021   MCV 90 12/13/2021   PLT 230 12/13/2021   Lab Results  Component Value Date   HGBA1C 6.1 (H) 12/13/2021   Lab Results  Component Value Date   CHOL 199 12/13/2021   HDL 54 12/13/2021   LDLCALC 107 (H) 12/13/2021   TRIG 221 (H) 12/13/2021   CHOLHDL 3.7 12/13/2021   Lab Results  Component Value Date   TSH 2.020 12/04/2019     Assessment and Plan:  1. Ganglion cyst of both wrists Patient reassured benign nature of ganglion cysts, likely to spontaneously resolve.  Patient education attached.  May continue ice and ibuprofen for pain relief as needed.  Might also consider topical Voltaren applied to the left wrist.  Relative rest on the left side.  Return if worsening or if no improvement over the next 2 weeks.   Return if symptoms worsen or fail to improve.    Alvester Morin, PA-C, DMSc, Nutritionist [facility] [phone]

## 2022-08-31 NOTE — Patient Instructions (Signed)
-  It was a pleasure to see you today! Please review your visit summary for helpful information -I would encourage you to follow your care via MyChart where you can access lab results, notes, messages, and more -If you feel that we did a nice job today, please complete your after-visit survey and leave us a Google review! Your CMA today was Kieandra and your provider was Dan Waddell, PA-C, DMSc   

## 2022-09-30 ENCOUNTER — Ambulatory Visit: Payer: Self-pay

## 2022-09-30 NOTE — Telephone Encounter (Signed)
Chief Complaint: Covid+ Symptoms: mild cough, runny nose, headache, sore throat, chills Frequency: comes and goes Pertinent Negatives: Patient denies chest pain, SOB, fever Disposition: [] ED /[] Urgent Care (no appt availability in office) / [] Appointment(In office/virtual)/ []  [place] [facility]/ [] Home Care/ [] Refused Recommended Disposition /[] [place] [facility]/ [x]  Follow-up with PCP Additional Notes: Patient states she tested positive for Covid-19 this morning with a home self test. Patient stated her daughter came to visit on Monday and had tested positive for Covid 5 days prior. She stated she only was around her for about 15 minutes and wore a mask. Patient states she started having symptoms yesterday and it started with a sore throat. Patient reports her symptoms are mild. Care advice was given. Patient declined an appointment at this time and stated she will try over the counter treatment for now since symptoms are mild. Advised patient to callback if symptoms get worse. Patient verbalized understanding.  Summary: Covid +   Patient states that she tested positive for covid this morning. Patient said she is experiencing a headache, sore throat, cough. No appointments with pcp     Reason for Disposition  [1] HIGH RISK patient (e.g., weak immune system, age > 64 years, obesity with BMI 30 or higher, pregnant, chronic lung disease or other chronic medical condition) AND [2] COVID symptoms (e.g., cough, fever)  (Exceptions: Already seen by PCP and no new or worsening symptoms.)  Answer Assessment - Initial Assessment Questions 1. COVID-19 DIAGNOSIS: "How do you know that you have COVID?" (e.g., positive lab test or self-test, diagnosed by doctor or NP/PA, symptoms after exposure).     Tested positive today with self test at home 2. COVID-19 EXPOSURE: "Was there any known exposure to COVID before the symptoms began?" CDC Definition of close contact: within 6 feet (2 meters) for a total  of 15 minutes or more over a 24-hour period.      My daughter had covid and she was here visiting on her 5th day of symptoms 3. ONSET: "When did the COVID-19 symptoms start?"      Yesterday 4. WORST SYMPTOM: "What is your worst symptom?" (e.g., cough, fever, shortness of breath, muscle aches)     Headache 5. COUGH: "Do you have a cough?" If Yes, ask: "How bad is the cough?"       Yes, it is mild  6. FEVER: "Do you have a fever?" If Yes, ask: "What is your temperature, how was it measured, and when did it start?"     I don't think  7. RESPIRATORY STATUS: "Describe your breathing?" (e.g., normal; shortness of breath, wheezing, unable to speak)      Normal 8. BETTER-SAME-WORSE: "Are you getting better, staying the same or getting worse compared to yesterday?"  If getting worse, ask, "In what way?"     Headache is worse  9. OTHER SYMPTOMS: "Do you have any other symptoms?"  (e.g., chills, fatigue, headache, loss of smell or taste, muscle pain, sore throat)     Sore throat, runny nose, cough, headache, chills, sweating 10. HIGH RISK DISEASE: "Do you have any chronic medical problems?" (e.g., asthma, heart or lung disease, weak immune system, obesity, etc.)       obesity 11. VACCINE: "Have you had the COVID-19 vaccine?" If Yes, ask: "Which one, how many shots, when did you get it?"       Yes, I've about 4-6  Protocols used: Coronavirus (COVID-19) Diagnosed or Suspected-A-AH

## 2022-12-15 ENCOUNTER — Other Ambulatory Visit: Payer: Self-pay | Admitting: Internal Medicine

## 2022-12-15 DIAGNOSIS — Z1231 Encounter for screening mammogram for malignant neoplasm of breast: Secondary | ICD-10-CM

## 2022-12-16 ENCOUNTER — Ambulatory Visit (INDEPENDENT_AMBULATORY_CARE_PROVIDER_SITE_OTHER): Payer: PPO | Admitting: Internal Medicine

## 2022-12-16 ENCOUNTER — Encounter: Payer: Self-pay | Admitting: Internal Medicine

## 2022-12-16 VITALS — BP 112/78 | HR 91 | Ht 60.0 in | Wt 189.0 lb

## 2022-12-16 DIAGNOSIS — E785 Hyperlipidemia, unspecified: Secondary | ICD-10-CM

## 2022-12-16 DIAGNOSIS — Z0000 Encounter for general adult medical examination without abnormal findings: Secondary | ICD-10-CM

## 2022-12-16 DIAGNOSIS — Z1231 Encounter for screening mammogram for malignant neoplasm of breast: Secondary | ICD-10-CM | POA: Diagnosis not present

## 2022-12-16 DIAGNOSIS — R7303 Prediabetes: Secondary | ICD-10-CM

## 2022-12-16 DIAGNOSIS — M85859 Other specified disorders of bone density and structure, unspecified thigh: Secondary | ICD-10-CM | POA: Diagnosis not present

## 2022-12-16 MED ORDER — SIMVASTATIN 40 MG PO TABS: 40.0000 mg | ORAL_TABLET | Freq: Every day | ORAL | 3 refills | Status: DC

## 2022-12-16 NOTE — Assessment & Plan Note (Signed)
Managed with diet alone. Lab Results  Component Value Date   HGBA1C 6.1 (H) 12/13/2021

## 2022-12-16 NOTE — Assessment & Plan Note (Addendum)
By DEXA, no fracture history. Taking calcium and vitamin D. Recommend weight bearing exercise

## 2022-12-16 NOTE — Assessment & Plan Note (Signed)
LDL is  Lab Results  Component Value Date   LDLCALC 107 (H) 12/13/2021    Current regimen is atorvastatin.  Tolerating medications well without issues.

## 2022-12-16 NOTE — Patient Instructions (Signed)
Schedule a Dermatology skin check appointment - [facility] has a long time to next appointment but go ahead and schedule.

## 2022-12-16 NOTE — Progress Notes (Signed)
Date:  12/16/2022   Name:  Elizabeth Frye   DOB:  1955/06/30   MRN:  [id number]   Chief Complaint: Annual Exam Elizabeth Frye is a 67 y.o. female who presents today for her Complete Annual Exam. She feels well. She reports exercising - none. She reports she is sleeping well. Breast complaints - none.  She is trying to lose weight with intermittent fasting - so far not much success.  Just home from a trip to [place] which was fantastic but she did eat and drink more than usual.  Mammogram: 01/2022 - scheduled 02/13/23 DEXA: 01/2022 osteopenia Colonoscopy: 2019 repeat 5 yrs - scheduled 01/06/23  Health Maintenance Due  Topic Date Due   Colonoscopy  01/06/2023    Immunization History  Administered Date(s) Administered   Fluad Quad(high Dose 65+) 12/13/2021   Influenza Inj Mdck Quad Pf 11/02/2018   Influenza,inj,Quad PF,6+ Mos 12/10/2020   Influenza-Unspecified 11/01/2019, 11/03/2022   PFIZER(Purple Top)SARS-COV-2 Vaccination 04/04/2019, 04/29/2019, 11/01/2019   PNEUMOCOCCAL CONJUGATE-20 12/13/2021   Pfizer(Comirnaty)Fall Seasonal Vaccine 12 years and older 11/17/2022   Tdap 11/18/2015   Zoster Recombinant(Shingrix) 11/03/2022   Zoster, Live 11/20/2014     Hyperlipidemia This is a chronic problem. The problem is controlled. She has no history of chronic renal disease or hypothyroidism. Pertinent negatives include no chest pain, focal weakness or shortness of breath. Current antihyperlipidemic treatment includes statins. There are no compliance problems.     Review of Systems  Constitutional:  Negative for fatigue and unexpected weight change.  HENT:  Negative for nosebleeds.   Eyes:  Negative for visual disturbance.  Respiratory:  Negative for cough, chest tightness, shortness of breath and wheezing.   Cardiovascular:  Negative for chest pain, palpitations and leg swelling.  Gastrointestinal:  Negative for abdominal pain, constipation and diarrhea.  Neurological:  Negative  for dizziness, focal weakness, weakness, light-headedness and headaches.     Lab Results  Component Value Date   NA 142 12/13/2021   K 4.9 12/13/2021   CO2 21 12/13/2021   GLUCOSE 115 (H) 12/13/2021   BUN 17 12/13/2021   CREATININE 0.81 12/13/2021   CALCIUM 10.0 12/13/2021   EGFR 81 12/13/2021   GFRNONAA 75 12/04/2019   Lab Results  Component Value Date   CHOL 199 12/13/2021   HDL 54 12/13/2021   LDLCALC 107 (H) 12/13/2021   TRIG 221 (H) 12/13/2021   CHOLHDL 3.7 12/13/2021   Lab Results  Component Value Date   TSH 2.020 12/04/2019   Lab Results  Component Value Date   HGBA1C 6.1 (H) 12/13/2021   Lab Results  Component Value Date   WBC 6.1 12/13/2021   HGB 15.7 12/13/2021   HCT 46.6 12/13/2021   MCV 90 12/13/2021   PLT 230 12/13/2021   Lab Results  Component Value Date   ALT 19 12/13/2021   AST 22 12/13/2021   ALKPHOS 109 12/13/2021   BILITOT 0.3 12/13/2021   No results found for: "25OHVITD2", "25OHVITD3", "VD25OH"   Patient Active Problem List   Diagnosis Date Noted   Osteopenia 01/18/2022   Prediabetes 12/10/2021   Hyperlipidemia, mild 12/26/2018   BMI 33.0-33.9,adult 12/26/2018   Benign neoplasm of cecum    Benign neoplasm of transverse colon     Allergies  Allergen Reactions   Percocet [Oxycodone-Acetaminophen] Nausea Only    Dizziness   Sulfa Antibiotics Rash    Past Surgical History:  Procedure Laterality Date   ABDOMINAL HYSTERECTOMY     still have cervix  and one ovary   BREAST CYST EXCISION Right    neg   COLONOSCOPY WITH PROPOFOL N/A 01/05/2018   Procedure: COLONOSCOPY WITH PROPOFOL;  Surgeon: Midge Minium, MD;  Location: [facility];  Service: Endoscopy;  Laterality: N/A;   POLYPECTOMY  01/05/2018   Procedure: POLYPECTOMY INTESTINAL;  Surgeon: Midge Minium, MD;  Location: [facility];  Service: Endoscopy;;   PROLAPSED UTERINE FIBROID LIGATION      Social History   Tobacco Use   Smoking status: Never   Smokeless  tobacco: Never  Vaping Use   Vaping status: Never Used  Substance Use Topics   Alcohol use: Not Currently    Alcohol/week: 3.0 standard drinks of alcohol    Types: 3 Glasses of wine per week   Drug use: Never     Medication list has been reviewed and updated.  Current Meds  Medication Sig   Calcium Carbonate-Vitamin D (CALCIUM-VITAMIN D) 600-3.125 MG-MCG TABS Take by mouth.   potassium chloride (KLOR-CON M) 10 MEQ tablet Take 1 tablet (10 mEq total) by mouth daily.   [DISCONTINUED] simvastatin (ZOCOR) 40 MG tablet Take 1 tablet (40 mg total) by mouth daily.       12/16/2022    8:32 AM 08/31/2022   10:28 AM 12/13/2021    9:22 AM 12/22/2020   10:08 AM  GAD 7 : Generalized Anxiety Score  Nervous, Anxious, on Edge 0 0 0 0  Control/stop worrying 0 0 0 0  Worry too much - different things 0 0 0 0  Trouble relaxing 0 0 0 0  Restless 0 0 0 0  Easily annoyed or irritable 0 0 0 0  Afraid - awful might happen 0 0 0 0  Total GAD 7 Score 0 0 0 0  Anxiety Difficulty Not difficult at all Not difficult at all Not difficult at all        12/16/2022    8:31 AM 08/31/2022   10:28 AM 03/31/2022    8:15 AM  Depression screen PHQ 2/9  Decreased Interest 0 0 0  Down, Depressed, Hopeless 0 0 0  PHQ - 2 Score 0 0 0  Altered sleeping 0 0 0  Tired, decreased energy 0 0 0  Change in appetite 0 0 0  Feeling bad or failure about yourself  0 0 0  Trouble concentrating 0 0 0  Moving slowly or fidgety/restless 0 0 0  Suicidal thoughts 0 0 0  PHQ-9 Score 0 0 0  Difficult doing work/chores Not difficult at all Not difficult at all Not difficult at all    BP Readings from Last 3 Encounters:  12/16/22 112/78  08/31/22 124/78  12/13/21 122/70    Physical Exam Vitals and nursing note reviewed.  Constitutional:      General: She is not in acute distress.    Appearance: She is well-developed.  HENT:     Head: Normocephalic and atraumatic.     Right Ear: Tympanic membrane and ear canal normal.      Left Ear: Tympanic membrane and ear canal normal.     Nose:     Right Sinus: No maxillary sinus tenderness.     Left Sinus: No maxillary sinus tenderness.  Eyes:     General: No scleral icterus.       Right eye: No discharge.        Left eye: No discharge.     Conjunctiva/sclera: Conjunctivae normal.  Neck:     Thyroid: No thyromegaly.  Vascular: No carotid bruit.  Cardiovascular:     Rate and Rhythm: Normal rate and regular rhythm.     Pulses: Normal pulses.     Heart sounds: Normal heart sounds.  Pulmonary:     Effort: Pulmonary effort is normal. No respiratory distress.     Breath sounds: No wheezing.  Abdominal:     General: Bowel sounds are normal.     Palpations: Abdomen is soft.     Tenderness: There is no abdominal tenderness.  Musculoskeletal:     Cervical back: Normal range of motion. No erythema.     Right lower leg: No edema.     Left lower leg: No edema.  Lymphadenopathy:     Cervical: No cervical adenopathy.  Skin:    General: Skin is warm and dry.     Capillary Refill: Capillary refill takes less than 2 seconds.     Findings: No rash.  Neurological:     Mental Status: She is alert and oriented to person, place, and time.     Cranial Nerves: No cranial nerve deficit.     Sensory: No sensory deficit.     Deep Tendon Reflexes: Reflexes are normal and symmetric.  Psychiatric:        Attention and Perception: Attention normal.        Mood and Affect: Mood normal.     Wt Readings from Last 3 Encounters:  12/16/22 189 lb (85.7 kg)  08/31/22 188 lb (85.3 kg)  03/31/22 185 lb (83.9 kg)    BP 112/78   Pulse 91   Ht 5' (1.524 m)   Wt 189 lb (85.7 kg)   SpO2 95%   BMI 36.91 kg/m   Assessment and Plan:  Problem List Items Addressed This Visit       Unprioritized   Hyperlipidemia, mild (Chronic)    LDL is  Lab Results  Component Value Date   LDLCALC 107 (H) 12/13/2021    Current regimen is atorvastatin.  Tolerating medications well  without issues.       Relevant Medications   simvastatin (ZOCOR) 40 MG tablet   Other Relevant Orders   Lipid panel   Prediabetes (Chronic)    Managed with diet alone. Lab Results  Component Value Date   HGBA1C 6.1 (H) 12/13/2021         Relevant Orders   Comprehensive metabolic panel   Hemoglobin A1c   Osteopenia    By DEXA, no fracture history. Taking calcium and vitamin D. Recommend weight bearing exercise      Relevant Orders   VITAMIN D 25 Hydroxy (Vit-D Deficiency, Fractures)   Other Visit Diagnoses     Annual physical exam    -  Primary   continue IF and begin more regular exercise immunizations and screenings are all up to date   Relevant Orders   CBC with Differential/Platelet   Comprehensive metabolic panel   Hemoglobin A1c   Lipid panel   TSH   Encounter for screening mammogram for breast cancer       scheduled       Return in about 1 year (around 12/16/2023) for CPX.    Reubin Milan, MD [facility] [place]

## 2022-12-17 LAB — CBC WITH DIFFERENTIAL/PLATELET
Basophils Absolute: 0 10*3/uL (ref 0.0–0.2)
Basos: 1 %
EOS (ABSOLUTE): 0.1 10*3/uL (ref 0.0–0.4)
Eos: 1 %
Hematocrit: 49.1 % — ABNORMAL HIGH (ref 34.0–46.6)
Hemoglobin: 15.8 g/dL (ref 11.1–15.9)
Immature Grans (Abs): 0.1 10*3/uL (ref 0.0–0.1)
Immature Granulocytes: 1 %
Lymphocytes Absolute: 2.2 10*3/uL (ref 0.7–3.1)
Lymphs: 33 %
MCH: 29.8 pg (ref 26.6–33.0)
MCHC: 32.2 g/dL (ref 31.5–35.7)
MCV: 93 fL (ref 79–97)
Monocytes Absolute: 0.6 10*3/uL (ref 0.1–0.9)
Monocytes: 9 %
Neutrophils Absolute: 3.8 10*3/uL (ref 1.4–7.0)
Neutrophils: 55 %
Platelets: 239 10*3/uL (ref 150–450)
RBC: 5.3 x10E6/uL — ABNORMAL HIGH (ref 3.77–5.28)
RDW: 13.6 % (ref 11.7–15.4)
WBC: 6.9 10*3/uL (ref 3.4–10.8)

## 2022-12-17 LAB — COMPREHENSIVE METABOLIC PANEL
ALT: 30 [IU]/L (ref 0–32)
AST: 29 [IU]/L (ref 0–40)
Albumin: 4.4 g/dL (ref 3.9–4.9)
Alkaline Phosphatase: 79 [IU]/L (ref 44–121)
BUN/Creatinine Ratio: 24 (ref 12–28)
BUN: 19 mg/dL (ref 8–27)
Bilirubin Total: 0.4 mg/dL (ref 0.0–1.2)
CO2: 22 mmol/L (ref 20–29)
Calcium: 10 mg/dL (ref 8.7–10.3)
Chloride: 106 mmol/L (ref 96–106)
Creatinine, Ser: 0.79 mg/dL (ref 0.57–1.00)
Globulin, Total: 2.6 g/dL (ref 1.5–4.5)
Glucose: 107 mg/dL — ABNORMAL HIGH (ref 70–99)
Potassium: 5 mmol/L (ref 3.5–5.2)
Sodium: 142 mmol/L (ref 134–144)
Total Protein: 7 g/dL (ref 6.0–8.5)
eGFR: 82 mL/min/{1.73_m2} (ref >59)

## 2022-12-17 LAB — HEMOGLOBIN A1C
Est. average glucose Bld gHb Est-mCnc: 126 mg/dL
Hgb A1c MFr Bld: 6 % — ABNORMAL HIGH (ref 4.8–5.6)

## 2022-12-17 LAB — LIPID PANEL
Chol/HDL Ratio: 3.7 {ratio} (ref 0.0–4.4)
Cholesterol, Total: 169 mg/dL (ref 100–199)
HDL: 46 mg/dL (ref >39)
LDL Chol Calc (NIH): 90 mg/dL (ref 0–99)
Triglycerides: 193 mg/dL — ABNORMAL HIGH (ref 0–149)
VLDL Cholesterol Cal: 33 mg/dL (ref 5–40)

## 2022-12-17 LAB — TSH: TSH: 2 u[IU]/mL (ref 0.450–4.500)

## 2022-12-17 LAB — VITAMIN D 25 HYDROXY (VIT D DEFICIENCY, FRACTURES): Vit D, 25-Hydroxy: 40 ng/mL (ref 30.0–100.0)

## 2022-12-26 ENCOUNTER — Encounter: Payer: Self-pay | Admitting: Gastroenterology

## 2022-12-26 NOTE — Anesthesia Preprocedure Evaluation (Addendum)
Anesthesia Evaluation  Patient identified by MRN, date of birth, ID band Patient awake    Reviewed: Allergy & Precautions, H&P , NPO status , Patient's Chart, lab work & pertinent test results  Airway Mallampati: II  TM Distance: >3 FB Neck ROM: Full    Dental no notable dental hx.    Pulmonary neg pulmonary ROS   Pulmonary exam normal breath sounds clear to auscultation       Cardiovascular negative cardio ROS Normal cardiovascular exam Rhythm:Regular Rate:Normal     Neuro/Psych  Neuromuscular disease negative neurological ROS  negative psych ROS   GI/Hepatic negative GI ROS, Neg liver ROS,,,  Endo/Other  negative endocrine ROS    Renal/GU negative Renal ROS  negative genitourinary   Musculoskeletal negative musculoskeletal ROS (+)    Abdominal   Peds negative pediatric ROS (+)  Hematology negative hematology ROS (+)   Anesthesia Other Findings History of pulmonary embolus (PE)  DVT, lower extremity (HCC) Despite Epic note of pulmonary embolus and DVT, I can't find notes on this, nor any testing results.  Despite  Hyperlipidemia  Rotator cuff impingement syndrome of right shoulder Rotator cuff impingement syndrome, left Myalgia Chronic musculoskeletal pain  Reproductive/Obstetrics negative OB ROS                             Anesthesia Physical Anesthesia Plan  ASA: 3  Anesthesia Plan: General   Post-op Pain Management:    Induction: Intravenous  PONV Risk Score and Plan:   Airway Management Planned: Natural Airway and Nasal Cannula  Additional Equipment:   Intra-op Plan:   Post-operative Plan:   Informed Consent: I have reviewed the patients History and Physical, chart, labs and discussed the procedure including the risks, benefits and alternatives for the proposed anesthesia with the patient or authorized representative who has indicated his/her understanding and  acceptance.     Dental Advisory Given  Plan Discussed with: Anesthesiologist, CRNA and Surgeon  Anesthesia Plan Comments: (Patient consented for risks of anesthesia including but not limited to:  - adverse reactions to medications - risk of airway placement if required - damage to eyes, teeth, lips or other oral mucosa - nerve damage due to positioning  - sore throat or hoarseness - Damage to heart, brain, nerves, lungs, other parts of body or loss of life  Patient voiced understanding and assent.)        Anesthesia Quick Evaluation

## 2023-01-06 ENCOUNTER — Ambulatory Visit: Payer: PPO | Admitting: Anesthesiology

## 2023-01-06 ENCOUNTER — Encounter: Payer: Self-pay | Admitting: Gastroenterology

## 2023-01-06 ENCOUNTER — Ambulatory Visit
Admission: RE | Admit: 2023-01-06 | Discharge: 2023-01-06 | Disposition: A | Discharge status: Home / Self Care | Payer: PPO | Attending: Gastroenterology | Admitting: Gastroenterology

## 2023-01-06 ENCOUNTER — Encounter
Admission: RE | Disposition: A | Discharge status: Home / Self Care | Payer: Self-pay | Source: Home / Self Care | Attending: Gastroenterology

## 2023-01-06 ENCOUNTER — Other Ambulatory Visit: Payer: Self-pay

## 2023-01-06 DIAGNOSIS — E785 Hyperlipidemia, unspecified: Secondary | ICD-10-CM | POA: Diagnosis not present

## 2023-01-06 DIAGNOSIS — D122 Benign neoplasm of ascending colon: Secondary | ICD-10-CM | POA: Diagnosis not present

## 2023-01-06 DIAGNOSIS — Z1211 Encounter for screening for malignant neoplasm of colon: Secondary | ICD-10-CM | POA: Diagnosis not present

## 2023-01-06 DIAGNOSIS — K635 Polyp of colon: Secondary | ICD-10-CM | POA: Diagnosis not present

## 2023-01-06 DIAGNOSIS — K641 Second degree hemorrhoids: Secondary | ICD-10-CM | POA: Diagnosis not present

## 2023-01-06 DIAGNOSIS — D123 Benign neoplasm of transverse colon: Secondary | ICD-10-CM | POA: Insufficient documentation

## 2023-01-06 DIAGNOSIS — Z860100 Personal history of colon polyps, unspecified: Secondary | ICD-10-CM

## 2023-01-06 DIAGNOSIS — D125 Benign neoplasm of sigmoid colon: Secondary | ICD-10-CM | POA: Insufficient documentation

## 2023-01-06 HISTORY — PX: COLONOSCOPY WITH PROPOFOL: SHX5780

## 2023-01-06 HISTORY — PX: POLYPECTOMY: SHX5525

## 2023-01-06 SURGERY — COLONOSCOPY WITH PROPOFOL
Anesthesia: General | Site: Rectum

## 2023-01-06 MED ORDER — SODIUM CHLORIDE 0.9 % IV SOLN: INTRAVENOUS | Status: DC

## 2023-01-06 MED ORDER — PROPOFOL 10 MG/ML IV BOLUS
INTRAVENOUS | Status: DC | PRN
  Administered 2023-01-06 (×3): 25 mg via INTRAVENOUS
  Administered 2023-01-06: 50 mg via INTRAVENOUS
  Administered 2023-01-06 (×6): 25 mg via INTRAVENOUS

## 2023-01-06 MED ORDER — PROPOFOL 10 MG/ML IV BOLUS
INTRAVENOUS | Status: AC
Start: 2023-01-06 — End: ?
  Filled 2023-01-06: qty 40

## 2023-01-06 MED ORDER — PROPOFOL 10 MG/ML IV BOLUS
INTRAVENOUS | Status: AC
Start: 2023-01-06 — End: ?
  Filled 2023-01-06: qty 20

## 2023-01-06 MED ORDER — LACTATED RINGERS IV SOLN: INTRAVENOUS | Status: DC

## 2023-01-06 MED ORDER — STERILE WATER FOR IRRIGATION IR SOLN
Status: DC | PRN
  Administered 2023-01-06: 50 mL

## 2023-01-06 SURGICAL SUPPLY — 7 items
FORCEPS BIOP RAD 4 LRG CAP 4 (CUTTING FORCEPS) IMPLANT
GOWN CVR UNV OPN BCK APRN NK (MISCELLANEOUS) ×2 IMPLANT
KIT PRC NS LF DISP ENDO (KITS) ×1 IMPLANT
MANIFOLD NEPTUNE II (INSTRUMENTS) ×1 IMPLANT
SNARE COLD EXACTO (MISCELLANEOUS) IMPLANT
TRAP ETRAP POLY (MISCELLANEOUS) IMPLANT
WATER STERILE IRR 250ML POUR (IV SOLUTION) ×1 IMPLANT

## 2023-01-06 NOTE — Anesthesia Postprocedure Evaluation (Signed)
Anesthesia Post Note  Patient: Elizabeth Frye  Procedure(s) Performed: COLONOSCOPY WITH BIOPSY (Rectum) POLYPECTOMY (Rectum)  Patient location during evaluation: PACU Anesthesia Type: General Level of consciousness: awake and alert Pain management: pain level controlled Vital Signs Assessment: post-procedure vital signs reviewed and stable Respiratory status: spontaneous breathing, nonlabored ventilation, respiratory function stable and patient connected to nasal cannula oxygen Cardiovascular status: blood pressure returned to baseline and stable Postop Assessment: no apparent nausea or vomiting Anesthetic complications: no   No notable events documented.   Last Vitals:  Vitals:   01/06/23 0805 01/06/23 0815  BP: 120/80 124/77  Pulse: 68 71  Resp: 14 18  Temp: (!) 36.4 C   SpO2: 96% 97%    Last Pain:  Vitals:   01/06/23 0815  TempSrc:   PainSc: 0-No pain                 Mahamed Zalewski C Olson Lucarelli

## 2023-01-06 NOTE — Op Note (Signed)
[facility] Gastroenterology Patient Name: Elizabeth Frye Procedure Date: 01/06/2023 6:35 AM MRN: [id number] Account #: [id number] Date of Birth: 03/01/1955 Admit Type: Outpatient Age: 67 Room: [facility] OR ROOM 01 Gender: Female Note Status: Finalized Instrument Name: [id number] Procedure:             Colonoscopy Indications:           High risk colon cancer surveillance: Personal history                         of colonic polyps Providers:             Midge Minium MD, MD Referring MD:          Bari Edward, MD (Referring MD) Medicines:             Propofol per Anesthesia Complications:         No immediate complications. Procedure:             Pre-Anesthesia Assessment:                        - Prior to the procedure, a History and Physical was                         performed, and patient medications and allergies were                         reviewed. The patient's tolerance of previous                         anesthesia was also reviewed. The risks and benefits                         of the procedure and the sedation options and risks                         were discussed with the patient. All questions were                         answered, and informed consent was obtained. Prior                         Anticoagulants: The patient has taken no anticoagulant                         or antiplatelet agents. ASA Grade Assessment: II - A                         patient with mild systemic disease. After reviewing                         the risks and benefits, the patient was deemed in                         satisfactory condition to undergo the procedure.                        After obtaining informed consent, the colonoscope was  passed under direct vision. Throughout the procedure,                         the patient's blood pressure, pulse, and oxygen                         saturations were monitored continuously. The                          Colonoscope was introduced through the anus and                         advanced to the the cecum, identified by appendiceal                         orifice and ileocecal valve. The colonoscopy was                         performed without difficulty. The patient tolerated                         the procedure well. The quality of the bowel                         preparation was excellent. Findings:      The perianal and digital rectal examinations were normal.      Two semi-pedunculated polyps were found in the sigmoid colon. The polyps       were 3 to 8 mm in size. These polyps were removed with a cold snare.       Resection and retrieval were complete.      Two sessile polyps were found in the transverse colon. The polyps were 3       to 4 mm in size. These polyps were removed with a cold snare. Resection       and retrieval were complete.      Three sessile polyps were found in the ascending colon. The polyps were       3 to 5 mm in size. These polyps were removed with a cold snare.       Resection and retrieval were complete.      A 3 mm polyp was found in the ascending colon. The polyp was sessile.       The polyp was removed with a cold biopsy forceps. Resection and       retrieval were complete.      Non-bleeding internal hemorrhoids were found during retroflexion. The       hemorrhoids were Grade II (internal hemorrhoids that prolapse but reduce       spontaneously). Impression:            - Two 3 to 8 mm polyps in the sigmoid colon, removed                         with a cold snare. Resected and retrieved.                        - Two 3 to 4 mm polyps in the transverse colon,  removed with a cold snare. Resected and retrieved.                        - Three 3 to 5 mm polyps in the ascending colon,                         removed with a cold snare. Resected and retrieved.                        - One 3 mm polyp in the ascending colon, removed with                          a cold biopsy forceps. Resected and retrieved.                        - Non-bleeding internal hemorrhoids. Recommendation:        - Discharge patient to home.                        - Resume previous diet.                        - Continue present medications.                        - Await pathology results.                        - Repeat colonoscopy in 3 years for surveillance. Procedure Code(s):     --- Professional ---                        [phone], Colonoscopy, flexible; with removal of                         tumor(s), polyp(s), or other lesion(s) by snare                         technique                        [id number], 59, Colonoscopy, flexible; with biopsy, single                         or multiple Diagnosis Code(s):     --- Professional ---                        Z86.010, Personal history of colonic polyps                        D12.5, Benign neoplasm of sigmoid colon CPT copyright 2022 American Medical Association. All rights reserved. The codes documented in this report are preliminary and upon coder review may  be revised to meet current compliance requirements. Midge Minium MD, MD 01/06/2023 8:03:40 AM This report has been signed electronically. Number of Addenda: 0 Note Initiated On: 01/06/2023 6:35 AM Scope Withdrawal Time: 0 hours 14 minutes 58 seconds  Total Procedure Duration: 0 hours 22 minutes 37 seconds  Estimated Blood Loss:  Estimated blood loss: none.      [facility]

## 2023-01-06 NOTE — H&P (Signed)
Elizabeth Minium, MD [facility] [address] [place], [place] [address] Phone:[phone] Fax : [phone]  Primary Care Physician:  Reubin Milan, MD Primary Gastroenterologist:  Dr. Servando Snare  Pre-Procedure History & Physical: HPI:  Elizabeth Frye is a 67 y.o. female is here for an colonoscopy.   Past Medical History:  Diagnosis Date   DVT, lower extremity (HCC) 1997   after injury to leg   History of pulmonary embolus (PE) 1997   Hyperlipidemia    Rotator cuff impingement syndrome of right shoulder 10/27/2020   Rotator cuff impingement syndrome, left 10/27/2020    Past Surgical History:  Procedure Laterality Date   ABDOMINAL HYSTERECTOMY     still have cervix and one ovary   BREAST CYST EXCISION Right    neg   COLONOSCOPY WITH PROPOFOL N/A 01/05/2018   Procedure: COLONOSCOPY WITH PROPOFOL;  Surgeon: Elizabeth Minium, MD;  Location: [facility];  Service: Endoscopy;  Laterality: N/A;   POLYPECTOMY  01/05/2018   Procedure: POLYPECTOMY INTESTINAL;  Surgeon: Elizabeth Minium, MD;  Location: [facility];  Service: Endoscopy;;   PROLAPSED UTERINE FIBROID LIGATION      Prior to Admission medications   Medication Sig Start Date End Date Taking? Authorizing Provider  Calcium Carbonate-Vitamin D (CALCIUM-VITAMIN D) 600-3.125 MG-MCG TABS Take by mouth.   Yes [provider]  potassium chloride (KLOR-CON M) 10 MEQ tablet Take 1 tablet (10 mEq total) by mouth daily. 12/13/21  Yes Reubin Milan, MD  simvastatin (ZOCOR) 40 MG tablet Take 1 tablet (40 mg total) by mouth daily. 12/16/22  Yes Reubin Milan, MD    Allergies as of 08/01/2022 - Review Complete 08/01/2022  Allergen Reaction Noted   Percocet [oxycodone-acetaminophen] Nausea Only 10/27/2020   Sulfa antibiotics Rash 05/01/2016    Family History  Problem Relation Age of Onset   Diabetes Mother    Heart attack Mother    Diabetes Sister    Cancer Maternal Aunt    Breast cancer Maternal Aunt     Heart attack Maternal Aunt     Social History   Socioeconomic History   Marital status: Married    Spouse name: Annye Durst   Number of children: 2   Years of education: 16   Highest education level: Bachelor's degree (e.g., BA, AB, BS)  Occupational History   Not on file  Tobacco Use   Smoking status: Never   Smokeless tobacco: Never  Vaping Use   Vaping status: Never Used  Substance and Sexual Activity   Alcohol use: Not Currently    Alcohol/week: 3.0 standard drinks of alcohol    Types: 3 Glasses of wine per week   Drug use: Never   Sexual activity: Yes    Partners: Male  Other Topics Concern   Not on file  Social History Narrative   Not on file   Social Drivers of Health   Financial Resource Strain: Low Risk  (03/31/2022)   Overall Financial Resource Strain (CARDIA)    Difficulty of Paying Living Expenses: Not hard at all  Food Insecurity: No Food Insecurity (03/31/2022)   Hunger Vital Sign    Worried About Running Out of Food in the Last Year: Never true    Ran Out of Food in the Last Year: Never true  Transportation Needs: No Transportation Needs (03/31/2022)   PRAPARE - Administrator, Civil Service (Medical): No    Lack of Transportation (Non-Medical): No  Physical Activity: Inactive (03/31/2022)  Exercise Vital Sign    Days of Exercise per Week: 0 days    Minutes of Exercise per Session: 0 min  Stress: No Stress Concern Present (03/31/2022)   [employer] of Occupational Health - Occupational Stress Questionnaire    Feeling of Stress : Not at all  Social Connections: Moderately Integrated (03/31/2022)   Social Connection and Isolation Panel [NHANES]    Frequency of Communication with Friends and Family: Once a week    Frequency of Social Gatherings with Friends and Family: Three times a week    Attends Religious Services: More than 4 times per year    Active Member of Clubs or Organizations: No    Attends Banker  Meetings: Never    Marital Status: Married  Catering manager Violence: Not At Risk (03/31/2022)   Humiliation, Afraid, Rape, and Kick questionnaire    Fear of Current or Ex-Partner: No    Emotionally Abused: No    Physically Abused: No    Sexually Abused: No    Review of Systems: See HPI, otherwise negative ROS  Physical Exam: BP (!) 140/90   Pulse 91   Temp 97.9 F (36.6 C) (Temporal)   Resp (!) 22   Ht 5' (1.524 m)   Wt 83.5 kg   SpO2 95%   BMI 35.94 kg/m  General:   Alert,  pleasant and cooperative in NAD Head:  Normocephalic and atraumatic. Neck:  Supple; no masses or thyromegaly. Lungs:  Clear throughout to auscultation.    Heart:  Regular rate and rhythm. Abdomen:  Soft, nontender and nondistended. Normal bowel sounds, without guarding, and without rebound.   Neurologic:  Alert and  oriented x4;  grossly normal neurologically.  Impression/Plan: Elizabeth Frye is here for an colonoscopy to be performed for a history of adenomatous polyps on 2019   Risks, benefits, limitations, and alternatives regarding  colonoscopy have been reviewed with the patient.  Questions have been answered.  All parties agreeable.   Elizabeth Minium, MD  01/06/2023, 7:07 AM

## 2023-01-06 NOTE — Transfer of Care (Signed)
Immediate Anesthesia Transfer of Care Note  Patient: Elizabeth Frye  Procedure(s) Performed: COLONOSCOPY WITH BIOPSY (Rectum) POLYPECTOMY (Rectum)  Patient Location: PACU  Anesthesia Type: General  Level of Consciousness: awake, alert  and patient cooperative  Airway and Oxygen Therapy: Patient Spontanous Breathing and Patient connected to supplemental oxygen  Post-op Assessment: Post-op Vital signs reviewed, Patient's Cardiovascular Status Stable, Respiratory Function Stable, Patent Airway and No signs of Nausea or vomiting  Post-op Vital Signs: Reviewed and stable  Complications: No notable events documented.

## 2023-01-09 ENCOUNTER — Encounter: Payer: Self-pay | Admitting: Gastroenterology

## 2023-01-09 LAB — SURGICAL PATHOLOGY

## 2023-01-23 ENCOUNTER — Ambulatory Visit: Payer: PPO

## 2023-02-13 ENCOUNTER — Ambulatory Visit
Admission: RE | Admit: 2023-02-13 | Discharge: 2023-02-13 | Disposition: A | Discharge status: Home / Self Care | Payer: PPO | Source: Ambulatory Visit | Attending: Internal Medicine | Admitting: Internal Medicine

## 2023-02-13 DIAGNOSIS — Z1231 Encounter for screening mammogram for malignant neoplasm of breast: Secondary | ICD-10-CM | POA: Diagnosis not present

## 2023-04-05 ENCOUNTER — Ambulatory Visit: Payer: PPO

## 2023-04-05 DIAGNOSIS — Z0000 Encounter for general adult medical examination without abnormal findings: Secondary | ICD-10-CM | POA: Diagnosis not present

## 2023-04-05 NOTE — Progress Notes (Signed)
 Subjective:   Elizabeth Frye is a 68 y.o. who presents for a Medicare Wellness preventive visit.  Visit Complete: Virtual I connected with  [name] on 04/05/23 by a audio enabled telemedicine application and verified that I am speaking with the correct person using two identifiers.  Patient Location: Home  Provider Location: Office/Clinic  I discussed the limitations of evaluation and management by telemedicine. The patient expressed understanding and agreed to proceed.  Vital Signs: Because this visit was a virtual/telehealth visit, some criteria may be missing or patient reported. Any vitals not documented were not able to be obtained and vitals that have been documented are patient reported.  VideoDeclined- This patient declined Librarian, academic. Therefore the visit was completed with audio only.  Persons Participating in Visit: Patient.  AWV Questionnaire: No: Patient Medicare AWV questionnaire was not completed prior to this visit.  Cardiac Risk Factors include: advanced age (>30men, >72 women);dyslipidemia;obesity (BMI >30kg/m2)     Objective:    There were no vitals filed for this visit. There is no height or weight on file to calculate BMI.     04/05/2023    2:42 PM 03/31/2022    8:17 AM 11/18/2020    1:59 PM 01/05/2018    8:16 AM 05/01/2016    2:52 PM  Advanced Directives  Does Patient Have a Medical Advance Directive? No No Yes No No  Would patient like information on creating a medical advance directive? No - Patient declined No - Patient declined  No - Patient declined No - Patient declined    Current Medications (verified) Outpatient Encounter Medications as of 04/05/2023  Medication Sig   Calcium Carbonate-Vitamin D (CALCIUM-VITAMIN D) 600-3.125 MG-MCG TABS Take by mouth.   potassium chloride (KLOR-CON M) 10 MEQ tablet Take 1 tablet (10 mEq total) by mouth daily.   simvastatin (ZOCOR) 40 MG tablet Take 1 tablet (40  mg total) by mouth daily.   No facility-administered encounter medications on file as of 04/05/2023.    Allergies (verified) Percocet [oxycodone-acetaminophen] and Sulfa antibiotics   History: Past Medical History:  Diagnosis Date   DVT, lower extremity (HCC) 1997   after injury to leg   History of pulmonary embolus (PE) 1997   Hyperlipidemia    Rotator cuff impingement syndrome of right shoulder 10/27/2020   Rotator cuff impingement syndrome, left 10/27/2020   Past Surgical History:  Procedure Laterality Date   ABDOMINAL HYSTERECTOMY     still have cervix and one ovary   BREAST CYST EXCISION Right    neg   COLONOSCOPY WITH PROPOFOL N/A 01/05/2018   Procedure: COLONOSCOPY WITH PROPOFOL;  Surgeon: Midge Minium, MD;  Location: [facility];  Service: Endoscopy;  Laterality: N/A;   COLONOSCOPY WITH PROPOFOL N/A 01/06/2023   Procedure: COLONOSCOPY WITH BIOPSY;  Surgeon: Midge Minium, MD;  Location: [facility];  Service: Endoscopy;  Laterality: N/A;   POLYPECTOMY  01/05/2018   Procedure: POLYPECTOMY INTESTINAL;  Surgeon: Midge Minium, MD;  Location: [facility];  Service: Endoscopy;;   POLYPECTOMY N/A 01/06/2023   Procedure: POLYPECTOMY;  Surgeon: Midge Minium, MD;  Location: [facility];  Service: Endoscopy;  Laterality: N/A;   PROLAPSED UTERINE FIBROID LIGATION     Family History  Problem Relation Age of Onset   Diabetes Mother    Heart attack Mother    Diabetes Sister    Cancer Maternal Aunt    Breast cancer Maternal Aunt    Heart attack Maternal Aunt  Social History   Socioeconomic History   Marital status: Married    Spouse name: Rhyanna Sorce   Number of children: 2   Years of education: 16   Highest education level: Bachelor's degree (e.g., BA, AB, BS)  Occupational History   Not on file  Tobacco Use   Smoking status: Never   Smokeless tobacco: Never  Vaping Use   Vaping status: Never Used  Substance and Sexual Activity    Alcohol use: Not Currently    Alcohol/week: 3.0 standard drinks of alcohol    Types: 3 Glasses of wine per week   Drug use: Never   Sexual activity: Yes    Partners: Male  Other Topics Concern   Not on file  Social History Narrative   Not on file   Social Drivers of Health   Financial Resource Strain: Low Risk  (04/05/2023)   Overall Financial Resource Strain (CARDIA)    Difficulty of Paying Living Expenses: Not hard at all  Food Insecurity: No Food Insecurity (04/05/2023)   Hunger Vital Sign    Worried About Running Out of Food in the Last Year: Never true    Ran Out of Food in the Last Year: Never true  Transportation Needs: No Transportation Needs (04/05/2023)   PRAPARE - Administrator, Civil Service (Medical): No    Lack of Transportation (Non-Medical): No  Physical Activity: Unknown (04/05/2023)   Exercise Vital Sign    Days of Exercise per Week: 0 days    Minutes of Exercise per Session: Not on file  Stress: No Stress Concern Present (04/05/2023)   [employer] of Occupational Health - Occupational Stress Questionnaire    Feeling of Stress : Not at all  Social Connections: Moderately Integrated (04/05/2023)   Social Connection and Isolation Panel [NHANES]    Frequency of Communication with Friends and Family: Once a week    Frequency of Social Gatherings with Friends and Family: Once a week    Attends Religious Services: More than 4 times per year    Active Member of [employer] or Organizations: Yes    Attends Engineer, structural: More than 4 times per year    Marital Status: Married    Tobacco Counseling Counseling given: Not Answered    Clinical Intake:  Pre-visit preparation completed: Yes  Pain : No/denies pain     BMI - recorded: 36.9 Nutritional Status: BMI > 30  Obese Nutritional Risks: None Diabetes: No  Lab Results  Component Value Date   HGBA1C 6.0 (H) 12/16/2022   HGBA1C 6.1 (H) 12/13/2021   HGBA1C 6.0 (H) 12/10/2020      How often do you need to have someone help you when you read instructions, pamphlets, or other written materials from your doctor or pharmacy?: 1 - Never  Interpreter Needed?: No  Information entered by :: Kennedy Bucker, LPN   Activities of Daily Living     04/05/2023    2:42 PM 04/05/2023    8:02 AM  In your present state of health, do you have any difficulty performing the following activities:  Hearing? 0 0  Vision? 0 0  Difficulty concentrating or making decisions? 0 0  Walking or climbing stairs? 0 0  Dressing or bathing? 0 0  Doing errands, shopping? 0 0  Preparing Food and eating ? N N  Using the Toilet? N N  In the past six months, have you accidently leaked urine? N N  Do you have problems with loss of  bowel control? N N  Managing your Medications? N N  Managing your Finances? N N  Housekeeping or managing your Housekeeping? N N    Patient Care Team: Reubin Milan, MD as PCP - General (Internal Medicine) Verne Carrow, OD (Optometry)  Indicate any recent Medical Services you may have received from other than [facility] providers in the past year (date may be approximate).     Assessment:   This is a routine wellness examination for Elizabeth Frye.  Hearing/Vision screen Hearing Screening - Comments:: NO AIDS Vision Screening - Comments:: [facility]- DR.SHADE   Goals Addressed             This Visit's Progress    DIET - INCREASE WATER INTAKE         Depression Screen     04/05/2023    2:40 PM 12/16/2022    8:31 AM 08/31/2022   10:28 AM 03/31/2022    8:15 AM 12/13/2021    9:22 AM 12/22/2020   10:08 AM 11/10/2020   10:49 AM  PHQ 2/9 Scores  PHQ - 2 Score 0 0 0 0 0 0 0  PHQ- 9 Score 0 0 0 0 0 0 0    Fall Risk     04/05/2023    2:42 PM 04/05/2023    8:02 AM 12/16/2022    8:31 AM 08/31/2022   10:28 AM 03/31/2022    8:18 AM  Fall Risk   Falls in the past year? 0 0 0 0 0  Number falls in past yr: 0  0 0 0  Injury with Fall? 0  0 0 0  Risk for fall due to : No  Fall Risks  No Fall Risks No Fall Risks No Fall Risks  Follow up Falls prevention discussed;Falls evaluation completed  Falls evaluation completed Falls evaluation completed Falls prevention discussed;Falls evaluation completed    MEDICARE RISK AT HOME: Medicare Risk at Home Any stairs in or around the home?: Yes If so, are there any without handrails?: No Home free of loose throw rugs in walkways, pet beds, electrical cords, etc?: Yes Adequate lighting in your home to reduce risk of falls?: Yes Life alert?: No Use of a cane, walker or w/c?: No Grab bars in the bathroom?: No Shower chair or bench in shower?: Yes Elevated toilet seat or a handicapped toilet?: Yes  TIMED UP AND GO:  Was the test performed?  No  Cognitive Function: 6CIT completed        04/05/2023    2:43 PM 03/31/2022    8:22 AM  6CIT Screen  What Year? 0 points 0 points  What month? 0 points 0 points  What time? 0 points 0 points  Count back from 20 0 points 0 points  Months in reverse 0 points 0 points  Repeat phrase 0 points 0 points  Total Score 0 points 0 points    Immunizations Immunization History  Administered Date(s) Administered   Fluad Quad(high Dose 65+) 12/13/2021   Influenza Inj Mdck Quad Pf 11/02/2018   Influenza,inj,Quad PF,6+ Mos 11/02/2017, 12/10/2020   Influenza,inj,quad, With Preservative 11/18/2013, 11/18/2014, 11/18/2015, 10/31/2016   Influenza-Unspecified 11/01/2019, 11/03/2022   PFIZER(Purple Top)SARS-COV-2 Vaccination 04/04/2019, 04/29/2019, 11/01/2019   PNEUMOCOCCAL CONJUGATE-20 12/13/2021   Pfizer(Comirnaty)Fall Seasonal Vaccine 12 years and older 11/17/2022   Tdap 11/18/2015   Zoster Recombinant(Shingrix) 11/03/2022   Zoster, Live 11/20/2014    Screening Tests Health Maintenance  Topic Date Due   Zoster Vaccines- Shingrix (2 of 2) 12/29/2022   COVID-19 Vaccine (  5 - 2024-25 season) 05/17/2023   MAMMOGRAM  02/13/2024   Medicare Annual Wellness (AWV)  04/04/2024    DTaP/Tdap/Td (2 - Td or Tdap) 11/17/2025   Colonoscopy  01/05/2026   DEXA SCAN  01/19/2027   Pneumonia Vaccine 72+ Years old  Completed   INFLUENZA VACCINE  Completed   Hepatitis C Screening  Completed   HPV VACCINES  Aged Out    Health Maintenance  Health Maintenance Due  Topic Date Due   Zoster Vaccines- Shingrix (2 of 2) 12/29/2022   Health Maintenance Items Addressed: UP TO DATE W/ MAMMOGRAM, COLONOSCOPY, BONE DENSITY SCAN & SHOTS  Additional Screening:  Vision Screening: Recommended annual ophthalmology exams for early detection of glaucoma and other disorders of the eye.  Dental Screening: Recommended annual dental exams for proper oral hygiene  Community Resource Referral / Chronic Care Management: CRR required this visit?  No   CCM required this visit?  No     Plan:     I have personally reviewed and noted the following in the patient's chart:   Medical and social history Use of alcohol, tobacco or illicit drugs  Current medications and supplements including opioid prescriptions. Patient is not currently taking opioid prescriptions. Functional ability and status Nutritional status Physical activity Advanced directives List of other physicians Hospitalizations, surgeries, and ER visits in previous 12 months Vitals Screenings to include cognitive, depression, and falls Referrals and appointments  In addition, I have reviewed and discussed with patient certain preventive protocols, quality metrics, and best practice recommendations. A written personalized care plan for preventive services as well as general preventive health recommendations were provided to patient.     Hal Hope, LPN   [id number]   After Visit Summary: (MyChart) Due to this being a telephonic visit, the after visit summary with patients personalized plan was offered to patient via MyChart   Notes: Nothing significant to report at this time.

## 2023-04-05 NOTE — Patient Instructions (Addendum)
 Ms. Gullick , Thank you for taking time to come for your Medicare Wellness Visit. I appreciate your ongoing commitment to your health goals. Please review the following plan we discussed and let me know if I can assist you in the future.   Referrals/Orders/Follow-Ups/Clinician Recommendations: NONE  This is a list of the screening recommended for you and due dates:  Health Maintenance  Topic Date Due   Zoster (Shingles) Vaccine (2 of 2) 12/29/2022   COVID-19 Vaccine (5 - 2024-25 season) 05/17/2023   Mammogram  02/13/2024   Medicare Annual Wellness Visit  04/04/2024   DTaP/Tdap/Td vaccine (2 - Td or Tdap) 11/17/2025   Colon Cancer Screening  01/05/2026   DEXA scan (bone density measurement)  01/19/2027   Pneumonia Vaccine  Completed   Flu Shot  Completed   Hepatitis C Screening  Completed   HPV Vaccine  Aged Out    Advanced directives: (ACP Link)Information on Advanced Care Planning can be found at [facility] Advance Health Care Directives Advance Health Care Directives. [email or web address]   Next Medicare Annual Wellness Visit scheduled for next year: Yes  04/10/24 @ 12:40 PM BY PHONE

## 2023-12-18 ENCOUNTER — Ambulatory Visit: Payer: Self-pay | Admitting: Internal Medicine

## 2023-12-18 ENCOUNTER — Encounter: Payer: Self-pay | Admitting: Internal Medicine

## 2023-12-18 VITALS — BP 118/70 | HR 87 | Ht 60.0 in | Wt 188.0 lb

## 2023-12-18 DIAGNOSIS — M85859 Other specified disorders of bone density and structure, unspecified thigh: Secondary | ICD-10-CM

## 2023-12-18 DIAGNOSIS — Z1231 Encounter for screening mammogram for malignant neoplasm of breast: Secondary | ICD-10-CM | POA: Diagnosis not present

## 2023-12-18 DIAGNOSIS — R7303 Prediabetes: Secondary | ICD-10-CM

## 2023-12-18 DIAGNOSIS — Z0000 Encounter for general adult medical examination without abnormal findings: Secondary | ICD-10-CM

## 2023-12-18 DIAGNOSIS — E785 Hyperlipidemia, unspecified: Secondary | ICD-10-CM

## 2023-12-18 MED ORDER — SIMVASTATIN 40 MG PO TABS: 40.0000 mg | ORAL_TABLET | Freq: Every day | ORAL | 3 refills | Status: AC

## 2023-12-18 NOTE — Patient Instructions (Signed)
 Call [facility] to schedule your mammogram at [phone].

## 2023-12-18 NOTE — Assessment & Plan Note (Signed)
 Managed with diet only. Lab Results  Component Value Date   HGBA1C 6.0 (H) 12/16/2022

## 2023-12-18 NOTE — Assessment & Plan Note (Signed)
 Last DEXA 2024; no fx history On Calcium and vitamin D  - vitamin D  normal last year.

## 2023-12-18 NOTE — Assessment & Plan Note (Signed)
 LDL is  Lab Results  Component Value Date   LDLCALC 90 12/16/2022    Current medication regimen is simvastatin  40 mg. Goal LDL is < 100.

## 2023-12-18 NOTE — Progress Notes (Signed)
 Date:  12/18/2023   Name:  Elizabeth Frye   DOB:  July 14, 1955   MRN:  [id number]   Chief Complaint: Annual Exam Elizabeth Frye is a 68 y.o. female who presents today for her Complete Annual Exam. She feels well. She reports exercising- none. She reports she is sleeping well. Breast complaints - none.  Health Maintenance  Topic Date Due   Breast Cancer Screening  02/13/2024   Medicare Annual Wellness Visit  04/04/2024   COVID-19 Vaccine (6 - 2025-26 season) 04/22/2024   DTaP/Tdap/Td vaccine (2 - Td or Tdap) 11/17/2025   Colon Cancer Screening  01/05/2026   Osteoporosis screening with Bone Density Scan  01/19/2027   Pneumococcal Vaccine for age over 50  Completed   Flu Shot  Completed   Hepatitis C Screening  Completed   Zoster (Shingles) Vaccine  Completed   Meningitis B Vaccine  Aged Out   Hyperlipidemia This is a chronic problem. The problem is controlled. Pertinent negatives include no chest pain, myalgias or shortness of breath. Current antihyperlipidemic treatment includes statins. The current treatment provides significant improvement of lipids.    Review of Systems  Constitutional:  Negative for fatigue and unexpected weight change.  HENT:  Negative for trouble swallowing.   Eyes:  Negative for visual disturbance.  Respiratory:  Negative for cough, chest tightness, shortness of breath and wheezing.   Cardiovascular:  Negative for chest pain, palpitations and leg swelling.  Gastrointestinal:  Negative for abdominal pain, constipation and diarrhea.  Genitourinary:  Negative for frequency and urgency.  Musculoskeletal:  Positive for arthralgias (mild knee OA and bilateral shoulder pain). Negative for myalgias.  Neurological:  Negative for dizziness, weakness, light-headedness and headaches.  Psychiatric/Behavioral:  Negative for dysphoric mood and sleep disturbance. The patient is not nervous/anxious.      Lab Results  Component Value Date   NA 142 12/16/2022    K 5.0 12/16/2022   CO2 22 12/16/2022   GLUCOSE 107 (H) 12/16/2022   BUN 19 12/16/2022   CREATININE 0.79 12/16/2022   CALCIUM 10.0 12/16/2022   EGFR 82 12/16/2022   GFRNONAA 75 12/04/2019   Lab Results  Component Value Date   CHOL 169 12/16/2022   HDL 46 12/16/2022   LDLCALC 90 12/16/2022   TRIG 193 (H) 12/16/2022   CHOLHDL 3.7 12/16/2022   Lab Results  Component Value Date   TSH 2.000 12/16/2022   Lab Results  Component Value Date   HGBA1C 6.0 (H) 12/16/2022   Lab Results  Component Value Date   WBC 6.9 12/16/2022   HGB 15.8 12/16/2022   HCT 49.1 (H) 12/16/2022   MCV 93 12/16/2022   PLT 239 12/16/2022   Lab Results  Component Value Date   ALT 30 12/16/2022   AST 29 12/16/2022   ALKPHOS 79 12/16/2022   BILITOT 0.4 12/16/2022   Lab Results  Component Value Date   VD25OH 40.0 12/16/2022     Patient Active Problem List   Diagnosis Date Noted   Obesity, morbid (HCC) 12/18/2023   History of colonic polyps 01/06/2023   Osteopenia 01/18/2022   Prediabetes 12/10/2021   Hyperlipidemia, mild 12/26/2018   BMI 33.0-33.9,adult 12/26/2018    Allergies  Allergen Reactions   Percocet [Oxycodone-Acetaminophen ] Nausea Only    Dizziness   Sulfa Antibiotics Rash    Past Surgical History:  Procedure Laterality Date   ABDOMINAL HYSTERECTOMY     still have cervix and one ovary   BREAST CYST EXCISION Right  neg   COLONOSCOPY WITH PROPOFOL  N/A 01/05/2018   Procedure: COLONOSCOPY WITH PROPOFOL ;  Surgeon: Jinny Carmine, MD;  Location: [facility];  Service: Endoscopy;  Laterality: N/A;   COLONOSCOPY WITH PROPOFOL  N/A 01/06/2023   Procedure: COLONOSCOPY WITH BIOPSY;  Surgeon: Jinny Carmine, MD;  Location: [facility];  Service: Endoscopy;  Laterality: N/A;   EYE SURGERY     POLYPECTOMY  01/05/2018   Procedure: POLYPECTOMY INTESTINAL;  Surgeon: Jinny Carmine, MD;  Location: [facility];  Service: Endoscopy;;   POLYPECTOMY N/A 01/06/2023    Procedure: POLYPECTOMY;  Surgeon: Jinny Carmine, MD;  Location: [facility];  Service: Endoscopy;  Laterality: N/A;   PROLAPSED UTERINE FIBROID LIGATION      Social History   Tobacco Use   Smoking status: Never   Smokeless tobacco: Never  Vaping Use   Vaping status: Never Used  Substance Use Topics   Alcohol use: Not Currently    Alcohol/week: 1.0 standard drink of alcohol    Types: 1 Glasses of wine per week   Drug use: Never     Medication list has been reviewed and updated.  Current Meds  Medication Sig   Calcium Carbonate-Vitamin D  (CALCIUM-VITAMIN D ) 600-3.125 MG-MCG TABS Take by mouth.   potassium chloride  (KLOR-CON  M) 10 MEQ tablet Take 1 tablet (10 mEq total) by mouth daily.   [DISCONTINUED] simvastatin  (ZOCOR ) 40 MG tablet Take 1 tablet (40 mg total) by mouth daily.       12/18/2023    8:11 AM 12/16/2022    8:32 AM 08/31/2022   10:28 AM 12/13/2021    9:22 AM  GAD 7 : Generalized Anxiety Score  Nervous, Anxious, on Edge 0 0 0 0  Control/stop worrying 0 0 0 0  Worry too much - different things 0 0 0 0  Trouble relaxing 0 0 0 0  Restless 0 0 0 0  Easily annoyed or irritable 0 0 0 0  Afraid - awful might happen 0 0 0 0  Total GAD 7 Score 0 0 0 0  Anxiety Difficulty Not difficult at all Not difficult at all Not difficult at all Not difficult at all       12/18/2023    8:11 AM 04/05/2023    2:40 PM 12/16/2022    8:31 AM  Depression screen PHQ 2/9  Decreased Interest 0 0 0  Down, Depressed, Hopeless 0 0 0  PHQ - 2 Score 0 0 0  Altered sleeping 0 0 0  Tired, decreased energy 0 0 0  Change in appetite 0 0 0  Feeling bad or failure about yourself  0 0 0  Trouble concentrating 0 0 0  Moving slowly or fidgety/restless 0 0 0  Suicidal thoughts 0 0 0  PHQ-9 Score 0 0  0   Difficult doing work/chores Not difficult at all Not difficult at all Not difficult at all     Data saved with a previous flowsheet row definition    BP Readings from Last 3 Encounters:   12/18/23 118/70  01/06/23 124/77  12/16/22 112/78    Physical Exam Vitals and nursing note reviewed.  Constitutional:      General: She is not in acute distress.    Appearance: She is well-developed.  HENT:     Head: Normocephalic and atraumatic.     Right Ear: Tympanic membrane and ear canal normal.     Left Ear: Tympanic membrane and ear canal normal.     Nose:  Right Sinus: No maxillary sinus tenderness.     Left Sinus: No maxillary sinus tenderness.  Eyes:     General: No scleral icterus.       Right eye: No discharge.        Left eye: No discharge.     Conjunctiva/sclera: Conjunctivae normal.  Neck:     Thyroid : No thyromegaly.     Vascular: No carotid bruit.  Cardiovascular:     Rate and Rhythm: Normal rate and regular rhythm.     Pulses: Normal pulses.     Heart sounds: Normal heart sounds.  Pulmonary:     Effort: Pulmonary effort is normal. No respiratory distress.     Breath sounds: No wheezing.  Abdominal:     General: Bowel sounds are normal.     Palpations: Abdomen is soft.     Tenderness: There is no abdominal tenderness.  Musculoskeletal:     Cervical back: Normal range of motion. No erythema.     Right lower leg: No edema.     Left lower leg: No edema.  Lymphadenopathy:     Cervical: No cervical adenopathy.  Skin:    General: Skin is warm and dry.     Capillary Refill: Capillary refill takes less than 2 seconds.     Findings: No rash.  Neurological:     General: No focal deficit present.     Mental Status: She is alert and oriented to person, place, and time.     Cranial Nerves: No cranial nerve deficit.     Sensory: No sensory deficit.     Deep Tendon Reflexes: Reflexes are normal and symmetric.  Psychiatric:        Attention and Perception: Attention normal.        Mood and Affect: Mood normal.     Wt Readings from Last 3 Encounters:  12/18/23 188 lb (85.3 kg)  01/06/23 184 lb (83.5 kg)  12/16/22 189 lb (85.7 kg)    BP 118/70    Pulse 87   Ht 5' (1.524 m)   Wt 188 lb (85.3 kg)   SpO2 98%   BMI 36.72 kg/m   Assessment and Plan:  Problem List Items Addressed This Visit       Unprioritized   Hyperlipidemia, mild (Chronic)   LDL is  Lab Results  Component Value Date   LDLCALC 90 12/16/2022    Current medication regimen is simvastatin  40 mg. Goal LDL is < 100.       Relevant Medications   simvastatin  (ZOCOR ) 40 MG tablet   Other Relevant Orders   Comprehensive metabolic panel with GFR   Lipid panel   Prediabetes (Chronic)   Managed with diet only. Lab Results  Component Value Date   HGBA1C 6.0 (H) 12/16/2022         Relevant Orders   CBC with Differential/Platelet   Hemoglobin A1c   Urinalysis, Routine w reflex microscopic   Osteopenia   Last DEXA 2024; no fx history On Calcium and vitamin D  - vitamin D  normal last year.      Relevant Orders   TSH   Obesity, morbid (HCC)   Other Visit Diagnoses       Annual physical exam    -  Primary   MM upcoming CRC screen and immunizations UTD     Encounter for screening mammogram for breast cancer       Relevant Orders   MM 3D SCREENING MAMMOGRAM BILATERAL BREAST  Annual exam in one year. No follow-ups on file.    Leita HILARIO Adie, MD [facility] [place]

## 2023-12-20 ENCOUNTER — Ambulatory Visit: Payer: Self-pay | Admitting: Internal Medicine

## 2023-12-20 LAB — COMPREHENSIVE METABOLIC PANEL WITH GFR
ALT: 22 IU/L (ref 0–32)
AST: 20 IU/L (ref 0–40)
Albumin: 4.4 g/dL (ref 3.9–4.9)
Alkaline Phosphatase: 76 IU/L (ref 49–135)
BUN/Creatinine Ratio: 19 (ref 12–28)
BUN: 15 mg/dL (ref 8–27)
Bilirubin Total: 0.4 mg/dL (ref 0.0–1.2)
CO2: 18 mmol/L — ABNORMAL LOW (ref 20–29)
Calcium: 9.8 mg/dL (ref 8.7–10.3)
Chloride: 106 mmol/L (ref 96–106)
Creatinine, Ser: 0.81 mg/dL (ref 0.57–1.00)
Globulin, Total: 2.3 g/dL (ref 1.5–4.5)
Glucose: 119 mg/dL — ABNORMAL HIGH (ref 70–99)
Potassium: 4.6 mmol/L (ref 3.5–5.2)
Sodium: 141 mmol/L (ref 134–144)
Total Protein: 6.7 g/dL (ref 6.0–8.5)
eGFR: 80 mL/min/1.73 (ref >59)

## 2023-12-20 LAB — URINALYSIS, ROUTINE W REFLEX MICROSCOPIC

## 2023-12-20 LAB — CBC WITH DIFFERENTIAL/PLATELET
Basophils Absolute: 0 x10E3/uL (ref 0.0–0.2)
Basos: 1 %
EOS (ABSOLUTE): 0.2 x10E3/uL (ref 0.0–0.4)
Eos: 3 %
Hematocrit: 49.6 % — ABNORMAL HIGH (ref 34.0–46.6)
Hemoglobin: 15.9 g/dL (ref 11.1–15.9)
Immature Grans (Abs): 0 x10E3/uL (ref 0.0–0.1)
Immature Granulocytes: 0 %
Lymphocytes Absolute: 2.5 x10E3/uL (ref 0.7–3.1)
Lymphs: 35 %
MCH: 30 pg (ref 26.6–33.0)
MCHC: 32.1 g/dL (ref 31.5–35.7)
MCV: 94 fL (ref 79–97)
Monocytes Absolute: 0.7 x10E3/uL (ref 0.1–0.9)
Monocytes: 10 %
Neutrophils Absolute: 3.8 x10E3/uL (ref 1.4–7.0)
Neutrophils: 51 %
Platelets: 239 x10E3/uL (ref 150–450)
RBC: 5.3 x10E6/uL — ABNORMAL HIGH (ref 3.77–5.28)
RDW: 13.7 % (ref 11.7–15.4)
WBC: 7.3 x10E3/uL (ref 3.4–10.8)

## 2023-12-20 LAB — TSH: TSH: 2.3 u[IU]/mL (ref 0.450–4.500)

## 2023-12-20 LAB — LIPID PANEL
Chol/HDL Ratio: 3.5 ratio (ref 0.0–4.4)
Cholesterol, Total: 183 mg/dL (ref 100–199)
HDL: 52 mg/dL (ref >39)
LDL Chol Calc (NIH): 94 mg/dL (ref 0–99)
Triglycerides: 216 mg/dL — ABNORMAL HIGH (ref 0–149)
VLDL Cholesterol Cal: 37 mg/dL (ref 5–40)

## 2023-12-20 LAB — HEMOGLOBIN A1C
Est. average glucose Bld gHb Est-mCnc: 123 mg/dL
Hgb A1c MFr Bld: 5.9 % — ABNORMAL HIGH (ref 4.8–5.6)

## 2024-01-08 ENCOUNTER — Telehealth: Payer: Self-pay

## 2024-01-08 DIAGNOSIS — Z1231 Encounter for screening mammogram for malignant neoplasm of breast: Secondary | ICD-10-CM

## 2024-01-08 NOTE — Telephone Encounter (Signed)
 Copied from CRM #[id number]. Topic: Clinical - Medical Advice >> Jan 08, 2024  2:25 PM Kevelyn M wrote: Reason for CRM: Patient calling in because the place where she is to have her mammogram will not do the mammogram because Dr. Justus is leaving. They told the patient her new doctor will have to do the order for them to complete the mammogram. Please advise.  Call back # [phone]

## 2024-01-10 NOTE — Telephone Encounter (Signed)
 Called patient to discuss her recent call, there was no answer to my phone call. Voicemail message was left for patient, informing her that the new order was placed and she may call to schedule her mammogram.

## 2024-02-14 ENCOUNTER — Ambulatory Visit
Admission: RE | Admit: 2024-02-14 | Discharge: 2024-02-14 | Disposition: A | Source: Ambulatory Visit | Attending: Student

## 2024-02-14 DIAGNOSIS — Z1231 Encounter for screening mammogram for malignant neoplasm of breast: Secondary | ICD-10-CM

## 2024-04-10 ENCOUNTER — Encounter

## 2024-12-18 ENCOUNTER — Encounter: Admitting: Student
